# Patient Record
Sex: Female | Born: 1935 | Race: White | Hispanic: Yes
Health system: Southern US, Community
[De-identification: ages and names within clinical notes are randomized; demographics above are authoritative.]

---

## 2001-06-04 ENCOUNTER — Ambulatory Visit (HOSPITAL_COMMUNITY): Admission: RE | Admit: 2001-06-04 | Discharge: 2001-06-04 | Payer: Self-pay | Admitting: Family Medicine

## 2001-10-11 ENCOUNTER — Ambulatory Visit (HOSPITAL_COMMUNITY): Admission: RE | Admit: 2001-10-11 | Discharge: 2001-10-11 | Payer: Self-pay | Admitting: Family Medicine

## 2001-10-11 ENCOUNTER — Encounter: Payer: Self-pay | Admitting: Family Medicine

## 2001-10-30 ENCOUNTER — Encounter: Admission: RE | Admit: 2001-10-30 | Discharge: 2001-10-30 | Payer: Self-pay | Admitting: Obstetrics & Gynecology

## 2001-12-10 ENCOUNTER — Ambulatory Visit (HOSPITAL_COMMUNITY): Admission: RE | Admit: 2001-12-10 | Discharge: 2001-12-10 | Payer: Self-pay | Admitting: Obstetrics & Gynecology

## 2001-12-10 ENCOUNTER — Encounter: Payer: Self-pay | Admitting: Obstetrics & Gynecology

## 2002-01-15 ENCOUNTER — Encounter: Admission: RE | Admit: 2002-01-15 | Discharge: 2002-01-15 | Payer: Self-pay | Admitting: Obstetrics & Gynecology

## 2002-07-12 ENCOUNTER — Encounter: Admission: RE | Admit: 2002-07-12 | Discharge: 2002-07-12 | Payer: Self-pay | Admitting: *Deleted

## 2003-09-23 ENCOUNTER — Ambulatory Visit (HOSPITAL_COMMUNITY): Admission: RE | Admit: 2003-09-23 | Discharge: 2003-09-23 | Payer: Self-pay | Admitting: Ophthalmology

## 2004-09-23 ENCOUNTER — Ambulatory Visit: Payer: Self-pay | Admitting: Family Medicine

## 2004-10-11 ENCOUNTER — Ambulatory Visit: Payer: Self-pay | Admitting: Family Medicine

## 2004-10-13 ENCOUNTER — Ambulatory Visit: Payer: Self-pay | Admitting: Family Medicine

## 2004-10-22 ENCOUNTER — Ambulatory Visit (HOSPITAL_COMMUNITY): Admission: RE | Admit: 2004-10-22 | Discharge: 2004-10-22 | Payer: Self-pay | Admitting: Family Medicine

## 2005-02-23 ENCOUNTER — Ambulatory Visit: Payer: Self-pay | Admitting: Family Medicine

## 2005-02-28 ENCOUNTER — Ambulatory Visit: Payer: Self-pay | Admitting: *Deleted

## 2006-11-06 ENCOUNTER — Ambulatory Visit (HOSPITAL_COMMUNITY): Admission: RE | Admit: 2006-11-06 | Discharge: 2006-11-06 | Payer: Self-pay | Admitting: Otolaryngology

## 2006-11-14 ENCOUNTER — Other Ambulatory Visit: Admission: RE | Admit: 2006-11-14 | Discharge: 2006-11-14 | Payer: Self-pay | Admitting: Diagnostic Radiology

## 2006-11-14 ENCOUNTER — Encounter: Admission: RE | Admit: 2006-11-14 | Discharge: 2006-11-14 | Payer: Self-pay | Admitting: Otolaryngology

## 2006-11-14 ENCOUNTER — Encounter (INDEPENDENT_AMBULATORY_CARE_PROVIDER_SITE_OTHER): Payer: Self-pay | Admitting: Specialist

## 2007-03-21 ENCOUNTER — Ambulatory Visit: Payer: Self-pay | Admitting: Family Medicine

## 2007-03-28 ENCOUNTER — Ambulatory Visit: Payer: Self-pay | Admitting: Family Medicine

## 2007-04-03 ENCOUNTER — Ambulatory Visit: Payer: Self-pay | Admitting: Family Medicine

## 2007-06-04 ENCOUNTER — Ambulatory Visit: Payer: Self-pay | Admitting: Family Medicine

## 2007-06-19 ENCOUNTER — Ambulatory Visit (HOSPITAL_COMMUNITY): Admission: RE | Admit: 2007-06-19 | Discharge: 2007-06-19 | Payer: Self-pay | Admitting: Family Medicine

## 2007-09-12 ENCOUNTER — Encounter (INDEPENDENT_AMBULATORY_CARE_PROVIDER_SITE_OTHER): Payer: Self-pay | Admitting: *Deleted

## 2007-11-02 ENCOUNTER — Encounter (INDEPENDENT_AMBULATORY_CARE_PROVIDER_SITE_OTHER): Payer: Self-pay | Admitting: Nurse Practitioner

## 2008-03-27 ENCOUNTER — Ambulatory Visit: Payer: Self-pay | Admitting: Nurse Practitioner

## 2008-03-27 LAB — CONVERTED CEMR LAB
Eosinophils Relative: 1 % (ref 0–5)
HCT: 40.9 % (ref 36.0–46.0)
Lymphocytes Relative: 24 % (ref 12–46)
Lymphs Abs: 1.5 10*3/uL (ref 0.7–4.0)
Monocytes Relative: 14 % — ABNORMAL HIGH (ref 3–12)
Neutrophils Relative %: 61 % (ref 43–77)
Platelets: 207 10*3/uL (ref 150–400)
RBC: 4.35 M/uL (ref 3.87–5.11)
WBC: 6.3 10*3/uL (ref 4.0–10.5)

## 2008-05-27 ENCOUNTER — Ambulatory Visit: Payer: Self-pay | Admitting: Family Medicine

## 2008-05-27 DIAGNOSIS — L719 Rosacea, unspecified: Secondary | ICD-10-CM | POA: Insufficient documentation

## 2008-05-27 DIAGNOSIS — K029 Dental caries, unspecified: Secondary | ICD-10-CM | POA: Insufficient documentation

## 2008-05-27 DIAGNOSIS — I10 Essential (primary) hypertension: Secondary | ICD-10-CM | POA: Insufficient documentation

## 2008-06-02 ENCOUNTER — Encounter (INDEPENDENT_AMBULATORY_CARE_PROVIDER_SITE_OTHER): Payer: Self-pay | Admitting: Family Medicine

## 2008-11-11 ENCOUNTER — Telehealth (INDEPENDENT_AMBULATORY_CARE_PROVIDER_SITE_OTHER): Payer: Self-pay | Admitting: *Deleted

## 2008-12-15 ENCOUNTER — Ambulatory Visit: Payer: Self-pay | Admitting: Family Medicine

## 2008-12-15 DIAGNOSIS — M546 Pain in thoracic spine: Secondary | ICD-10-CM | POA: Insufficient documentation

## 2008-12-25 ENCOUNTER — Ambulatory Visit (HOSPITAL_COMMUNITY): Admission: RE | Admit: 2008-12-25 | Discharge: 2008-12-25 | Payer: Self-pay | Admitting: Family Medicine

## 2009-04-21 ENCOUNTER — Ambulatory Visit: Payer: Self-pay | Admitting: Family Medicine

## 2009-04-21 ENCOUNTER — Encounter (INDEPENDENT_AMBULATORY_CARE_PROVIDER_SITE_OTHER): Payer: Self-pay | Admitting: Family Medicine

## 2009-04-21 LAB — CONVERTED CEMR LAB
Bilirubin Urine: NEGATIVE
Glucose, Urine, Semiquant: 100
Protein, U semiquant: NEGATIVE

## 2009-04-30 ENCOUNTER — Ambulatory Visit (HOSPITAL_COMMUNITY): Admission: RE | Admit: 2009-04-30 | Discharge: 2009-04-30 | Payer: Self-pay | Admitting: Family Medicine

## 2009-05-07 ENCOUNTER — Encounter (INDEPENDENT_AMBULATORY_CARE_PROVIDER_SITE_OTHER): Payer: Self-pay | Admitting: Family Medicine

## 2009-05-08 ENCOUNTER — Ambulatory Visit: Payer: Self-pay | Admitting: Family Medicine

## 2009-05-13 DIAGNOSIS — E785 Hyperlipidemia, unspecified: Secondary | ICD-10-CM

## 2009-05-13 LAB — CONVERTED CEMR LAB
Albumin: 4.2 g/dL (ref 3.5–5.2)
BUN: 22 mg/dL (ref 6–23)
Calcium: 9.2 mg/dL (ref 8.4–10.5)
Chloride: 106 meq/L (ref 96–112)
Cholesterol: 214 mg/dL — ABNORMAL HIGH (ref 0–200)
Glucose, Bld: 91 mg/dL (ref 70–99)
HDL: 38 mg/dL — ABNORMAL LOW (ref >39)
Potassium: 4.8 meq/L (ref 3.5–5.3)
Triglycerides: 233 mg/dL — ABNORMAL HIGH (ref <150)

## 2009-09-29 ENCOUNTER — Ambulatory Visit: Payer: Self-pay | Admitting: Physician Assistant

## 2009-10-13 ENCOUNTER — Telehealth: Payer: Self-pay | Admitting: Physician Assistant

## 2009-11-02 ENCOUNTER — Ambulatory Visit: Payer: Self-pay | Admitting: Physician Assistant

## 2009-12-09 LAB — CONVERTED CEMR LAB
Total CHOL/HDL Ratio: 6
VLDL: 53 mg/dL — ABNORMAL HIGH (ref 0–40)

## 2010-01-18 ENCOUNTER — Telehealth: Payer: Self-pay | Admitting: Physician Assistant

## 2010-02-10 ENCOUNTER — Ambulatory Visit: Payer: Self-pay | Admitting: Physician Assistant

## 2010-02-22 LAB — CONVERTED CEMR LAB
Alkaline Phosphatase: 67 units/L (ref 39–117)
Cholesterol: 178 mg/dL (ref 0–200)
Indirect Bilirubin: 0.3 mg/dL (ref 0.0–0.9)
LDL Cholesterol: 98 mg/dL (ref 0–99)
Total Protein: 7.7 g/dL (ref 6.0–8.3)
Triglycerides: 186 mg/dL — ABNORMAL HIGH (ref <150)

## 2010-04-14 ENCOUNTER — Ambulatory Visit: Payer: Self-pay | Admitting: Physician Assistant

## 2010-04-14 DIAGNOSIS — R109 Unspecified abdominal pain: Secondary | ICD-10-CM

## 2010-04-14 LAB — CONVERTED CEMR LAB
Ketones, urine, test strip: NEGATIVE
Nitrite: NEGATIVE
Specific Gravity, Urine: >=1.03
Urobilinogen, UA: 0.2

## 2010-04-15 ENCOUNTER — Encounter: Payer: Self-pay | Admitting: Physician Assistant

## 2010-04-15 LAB — CONVERTED CEMR LAB
Albumin: 4.5 g/dL (ref 3.5–5.2)
BUN: 26 mg/dL — ABNORMAL HIGH (ref 6–23)
Basophils Relative: 0 % (ref 0–1)
CO2: 23 meq/L (ref 19–32)
Calcium: 9.7 mg/dL (ref 8.4–10.5)
Chloride: 104 meq/L (ref 96–112)
Creatinine, Ser: 0.9 mg/dL (ref 0.40–1.20)
Eosinophils Absolute: 0.1 10*3/uL (ref 0.0–0.7)
Eosinophils Relative: 1 % (ref 0–5)
Glucose, Bld: 111 mg/dL — ABNORMAL HIGH (ref 70–99)
HCT: 39 % (ref 36.0–46.0)
Hemoglobin: 12.9 g/dL (ref 12.0–15.0)
MCHC: 33.1 g/dL (ref 30.0–36.0)
MCV: 93.5 fL (ref 78.0–100.0)
Monocytes Absolute: 0.4 10*3/uL (ref 0.1–1.0)
Monocytes Relative: 6 % (ref 3–12)
Neutrophils Relative %: 59 % (ref 43–77)
RBC: 4.17 M/uL (ref 3.87–5.11)
TSH: 1.828 microintl units/mL (ref 0.350–4.500)

## 2010-05-20 ENCOUNTER — Ambulatory Visit (HOSPITAL_COMMUNITY): Admission: RE | Admit: 2010-05-20 | Discharge: 2010-05-20 | Payer: Self-pay | Admitting: Internal Medicine

## 2010-06-14 ENCOUNTER — Telehealth: Payer: Self-pay | Admitting: Physician Assistant

## 2010-06-14 ENCOUNTER — Ambulatory Visit: Payer: Self-pay | Admitting: Physician Assistant

## 2010-06-14 DIAGNOSIS — R82998 Other abnormal findings in urine: Secondary | ICD-10-CM | POA: Insufficient documentation

## 2010-06-14 DIAGNOSIS — E041 Nontoxic single thyroid nodule: Secondary | ICD-10-CM

## 2010-06-14 DIAGNOSIS — E739 Lactose intolerance, unspecified: Secondary | ICD-10-CM | POA: Insufficient documentation

## 2010-06-14 LAB — CONVERTED CEMR LAB
Bilirubin Urine: NEGATIVE
Ketones, urine, test strip: NEGATIVE
Specific Gravity, Urine: >=1.03
pH: 5.5

## 2010-06-15 ENCOUNTER — Encounter: Payer: Self-pay | Admitting: Physician Assistant

## 2010-06-16 LAB — CONVERTED CEMR LAB
Cholesterol: 158 mg/dL (ref 0–200)
Crystals: NONE SEEN
HDL goal, serum: 40 mg/dL
HDL: 38 mg/dL — ABNORMAL LOW (ref >39)
LDL Goal: 130 mg/dL
RBC / HPF: NONE SEEN (ref <3)
Total CHOL/HDL Ratio: 4.2
WBC, UA: NONE SEEN cells/hpf (ref <3)

## 2010-06-18 ENCOUNTER — Encounter: Payer: Self-pay | Admitting: Physician Assistant

## 2010-06-18 ENCOUNTER — Ambulatory Visit (HOSPITAL_COMMUNITY): Admission: RE | Admit: 2010-06-18 | Discharge: 2010-06-18 | Payer: Self-pay | Admitting: Internal Medicine

## 2010-06-24 ENCOUNTER — Encounter: Payer: Self-pay | Admitting: Physician Assistant

## 2010-06-25 ENCOUNTER — Ambulatory Visit: Payer: Self-pay | Admitting: Physician Assistant

## 2010-06-25 DIAGNOSIS — R42 Dizziness and giddiness: Secondary | ICD-10-CM

## 2010-06-25 DIAGNOSIS — R011 Cardiac murmur, unspecified: Secondary | ICD-10-CM

## 2010-07-01 ENCOUNTER — Ambulatory Visit: Payer: Self-pay | Admitting: Physician Assistant

## 2010-07-01 LAB — CONVERTED CEMR LAB
Nitrite: NEGATIVE
Urobilinogen, UA: 0.2
WBC Urine, dipstick: NEGATIVE
pH: 5

## 2010-07-02 LAB — CONVERTED CEMR LAB
Bacteria, UA: NONE SEEN
Casts: NONE SEEN /lpf
RBC / HPF: NONE SEEN (ref <3)
WBC, UA: NONE SEEN cells/hpf (ref <3)

## 2010-07-07 ENCOUNTER — Ambulatory Visit: Payer: Self-pay | Admitting: Physician Assistant

## 2010-07-07 DIAGNOSIS — R269 Unspecified abnormalities of gait and mobility: Secondary | ICD-10-CM | POA: Insufficient documentation

## 2010-07-13 ENCOUNTER — Encounter: Payer: Self-pay | Admitting: Physician Assistant

## 2010-07-14 ENCOUNTER — Encounter: Payer: Self-pay | Admitting: Physician Assistant

## 2010-07-14 ENCOUNTER — Ambulatory Visit: Payer: Self-pay | Admitting: Cardiology

## 2010-07-14 ENCOUNTER — Encounter (INDEPENDENT_AMBULATORY_CARE_PROVIDER_SITE_OTHER): Payer: Self-pay | Admitting: Internal Medicine

## 2010-07-14 ENCOUNTER — Ambulatory Visit (HOSPITAL_COMMUNITY): Admission: RE | Admit: 2010-07-14 | Discharge: 2010-07-14 | Payer: Self-pay | Admitting: Internal Medicine

## 2010-07-19 ENCOUNTER — Encounter (INDEPENDENT_AMBULATORY_CARE_PROVIDER_SITE_OTHER): Payer: Self-pay | Admitting: *Deleted

## 2010-07-22 ENCOUNTER — Encounter: Payer: Self-pay | Admitting: Physician Assistant

## 2010-07-22 DIAGNOSIS — M899 Disorder of bone, unspecified: Secondary | ICD-10-CM | POA: Insufficient documentation

## 2010-07-22 DIAGNOSIS — M949 Disorder of cartilage, unspecified: Secondary | ICD-10-CM

## 2010-08-19 ENCOUNTER — Ambulatory Visit (HOSPITAL_COMMUNITY): Admission: RE | Admit: 2010-08-19 | Discharge: 2010-08-19 | Payer: Self-pay | Admitting: Physician Assistant

## 2010-08-19 ENCOUNTER — Ambulatory Visit: Payer: Self-pay | Admitting: Physician Assistant

## 2010-08-23 DIAGNOSIS — M19019 Primary osteoarthritis, unspecified shoulder: Secondary | ICD-10-CM | POA: Insufficient documentation

## 2010-08-25 ENCOUNTER — Telehealth: Payer: Self-pay | Admitting: Physician Assistant

## 2010-08-25 ENCOUNTER — Ambulatory Visit: Payer: Self-pay | Admitting: Gastroenterology

## 2010-08-25 ENCOUNTER — Encounter (INDEPENDENT_AMBULATORY_CARE_PROVIDER_SITE_OTHER): Payer: Self-pay | Admitting: *Deleted

## 2010-09-08 ENCOUNTER — Ambulatory Visit: Payer: Self-pay | Admitting: Gastroenterology

## 2010-09-10 ENCOUNTER — Encounter: Payer: Self-pay | Admitting: Gastroenterology

## 2010-09-13 ENCOUNTER — Encounter
Admission: RE | Admit: 2010-09-13 | Discharge: 2010-12-12 | Payer: Self-pay | Source: Home / Self Care | Attending: Internal Medicine | Admitting: Internal Medicine

## 2010-09-15 ENCOUNTER — Encounter: Payer: Self-pay | Admitting: Physician Assistant

## 2010-09-23 ENCOUNTER — Encounter: Payer: Self-pay | Admitting: Physician Assistant

## 2010-10-02 IMAGING — CR DG SHOULDER 2+V*R*
4 series · 4 of 4 positions shown · non-contrast
Comparison: None.

CLINICAL DATA: Right shoulder pain.  No known injury .

RIGHT SHOULDER - 2+ VIEW

[w shoulder ap internal righ]
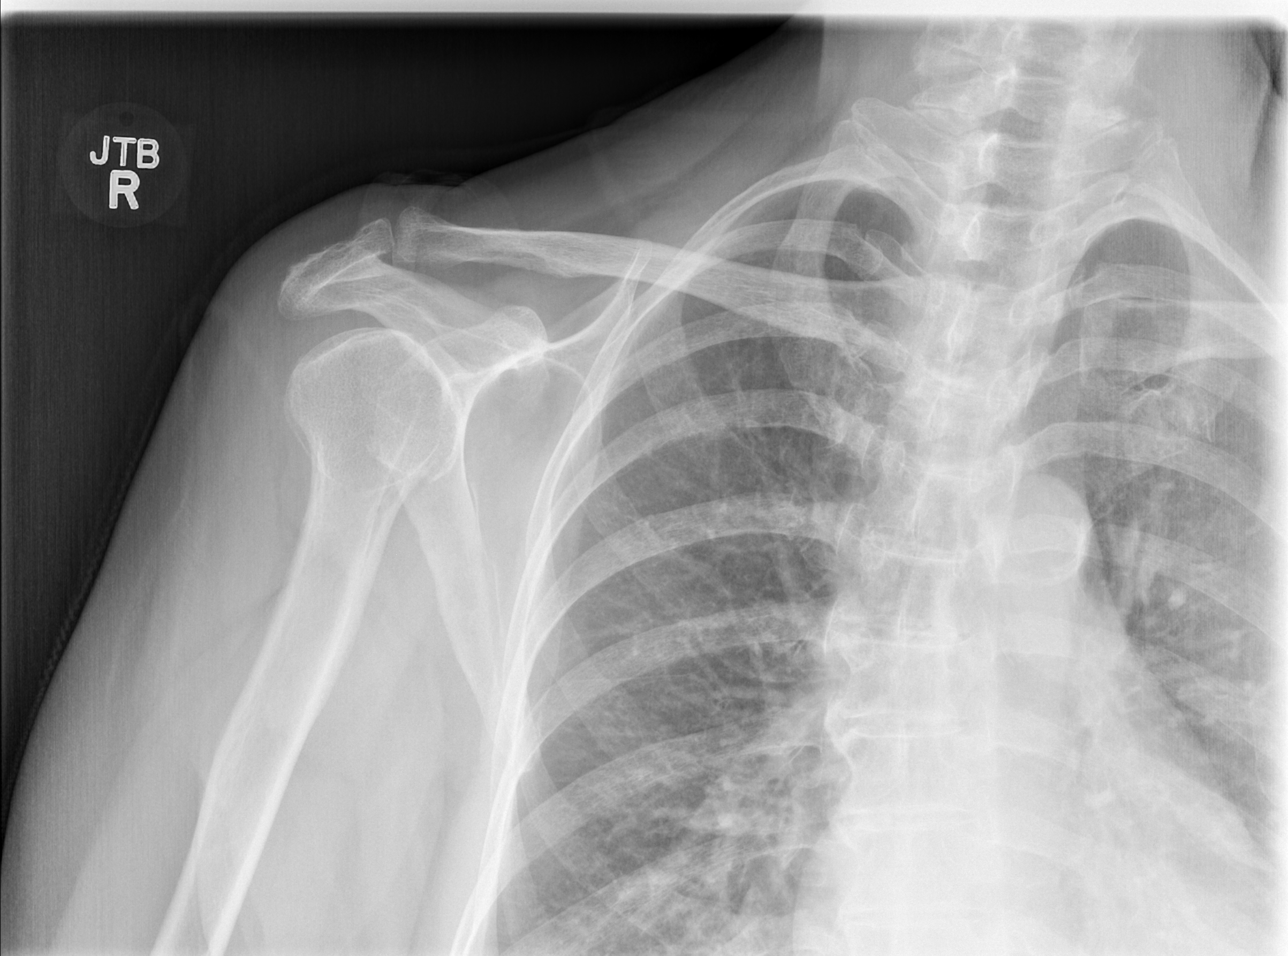

[w shoulder ap external righ]
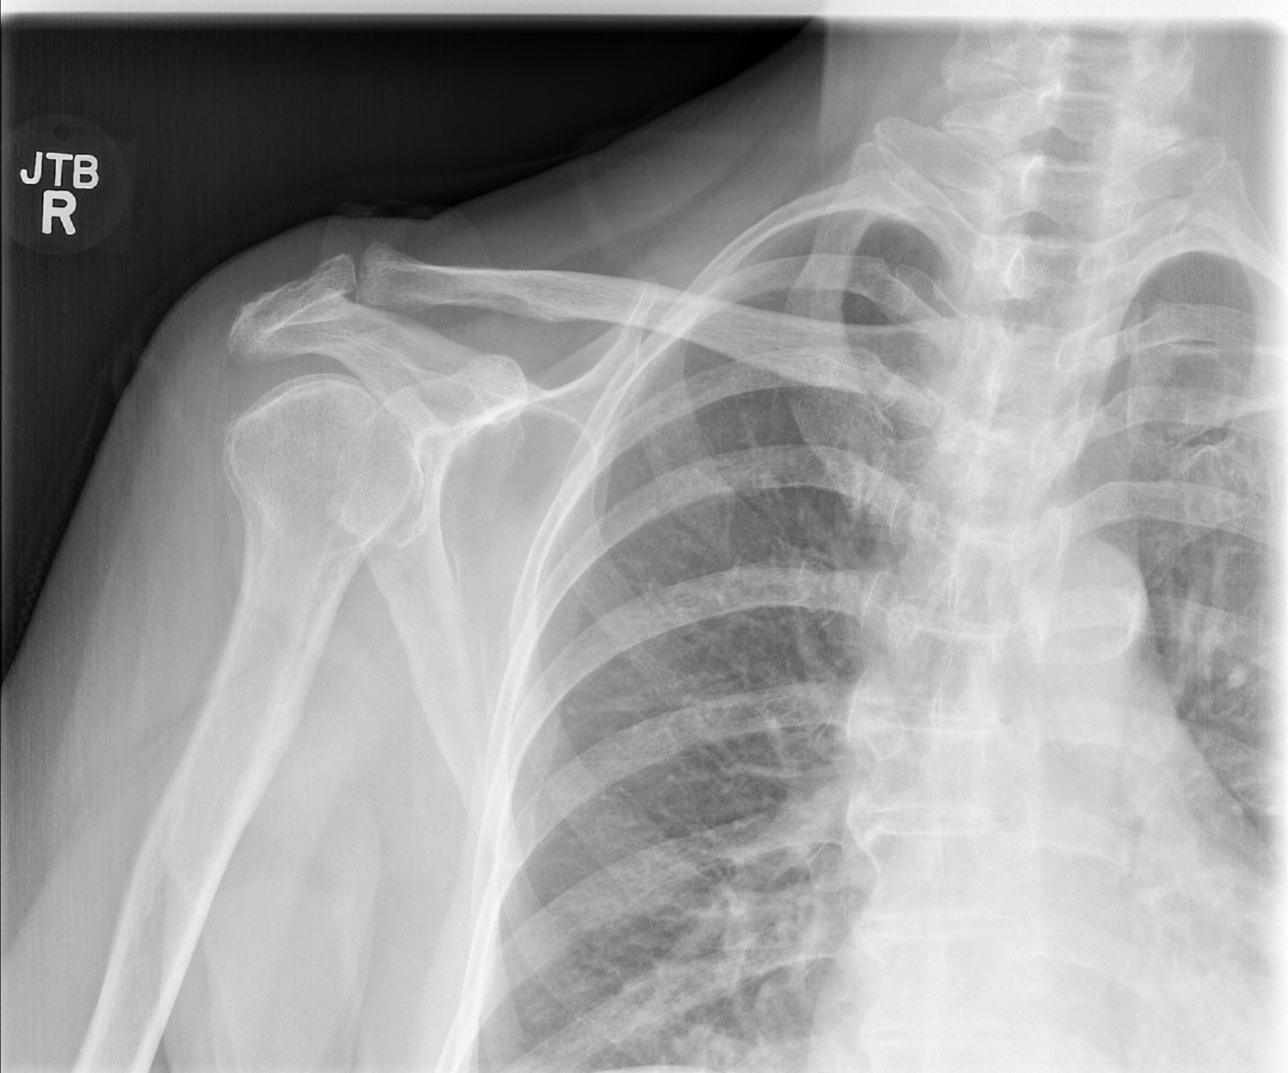

[w shoulder y view right (1 of 2)]
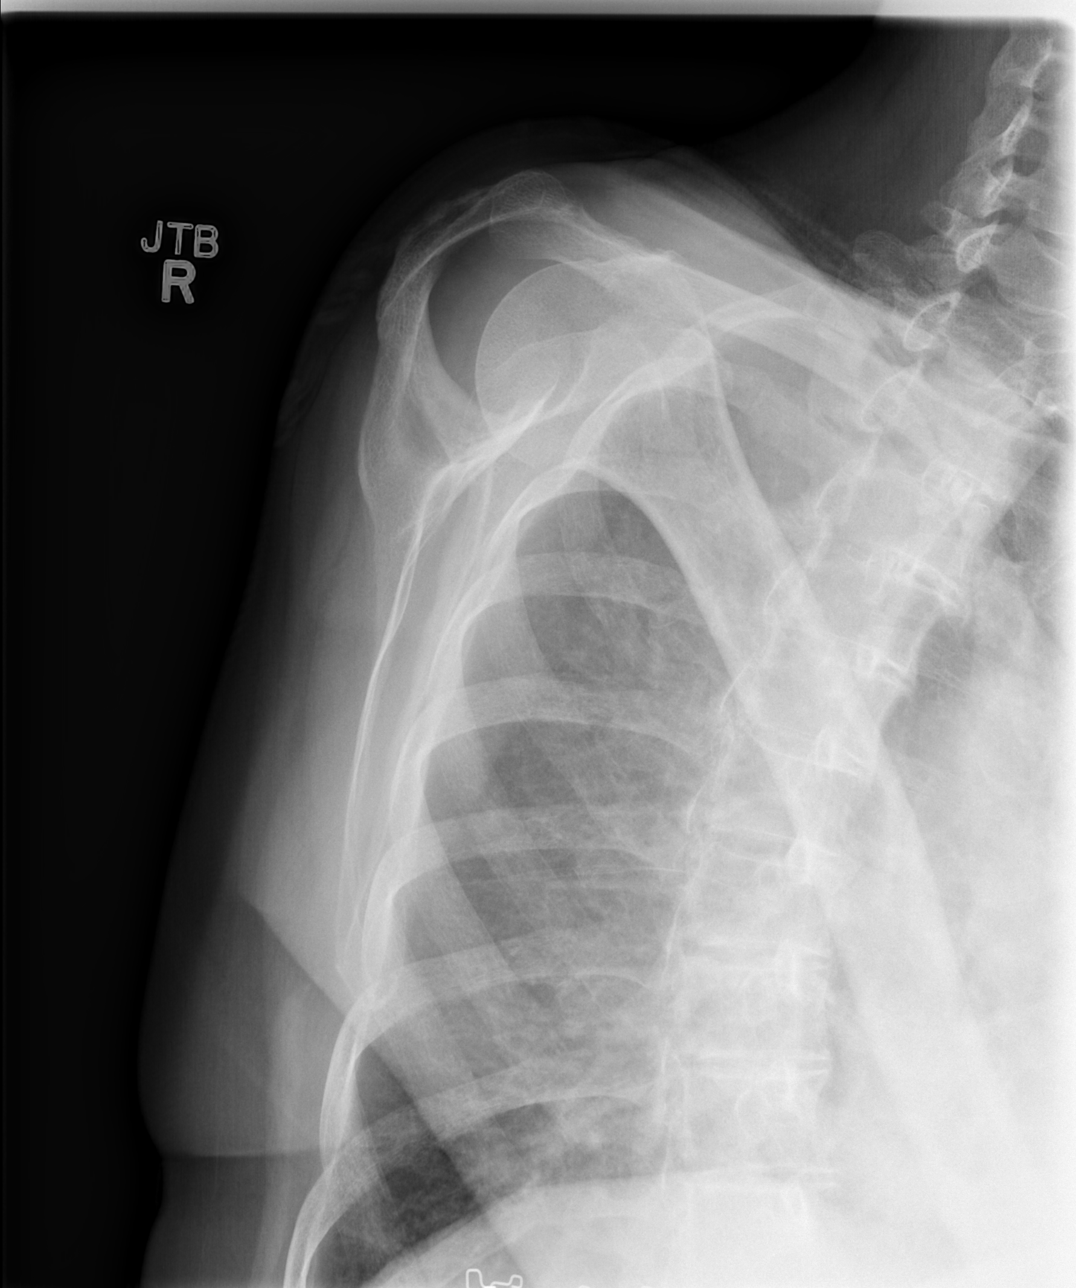

[w shoulder y view right (2 of 2)]
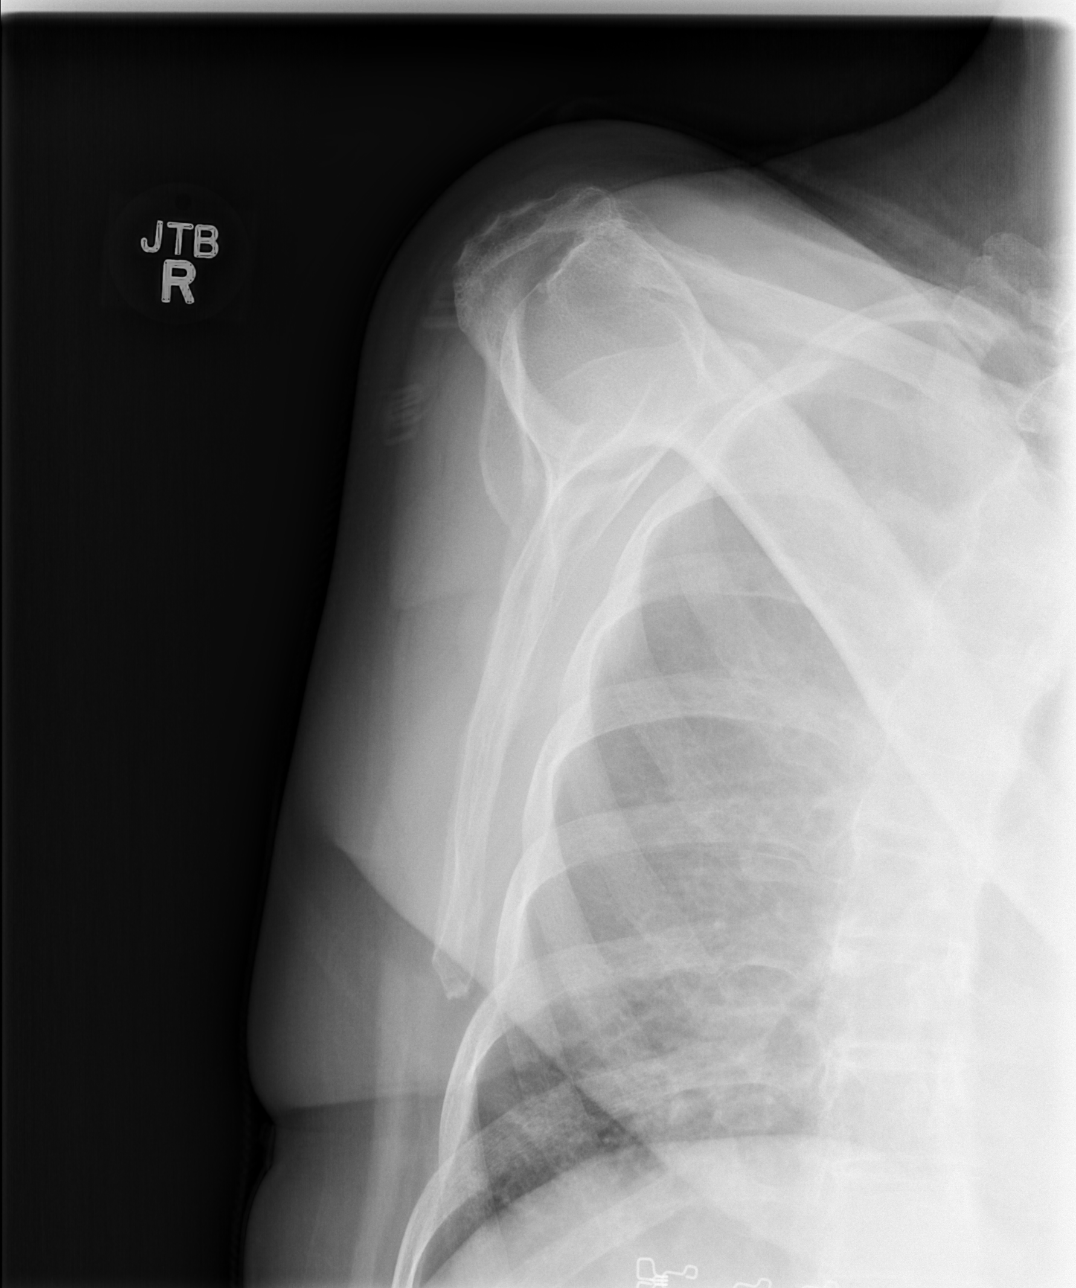

[4 of 4 positions shown; findings below may reference images not displayed]

FINDINGS: Ectasia and nonaneurysmal calcification of the thoracic
aorta is seen. Osteophytes are seen in the spine.  There is
osteopenic appearance of bones.

There is narrowing of AC joint space.  There is inferior spurring
of the distal clavicle at the AC joint.  There is congenital
downsloping of the acromion process.  No cervical rib is seen.  No
fracture or bony destruction is evident.  No calcific bursitis is
evident.  Tiny calcific density adjacent humeral head may reflect
small area of calcific tendonitis.
IMPRESSION: There is evidence of AC joint osteoarthritic change.  There is
inferior spurring of the distal clavicle at the AC joint.  There is
congenital downsloping of the acromion process.  These findings may
be associated with rotator cuff pathology and impingement in some
patients.  Tiny calcification near the humeral head may reflect a
small area of calcific tendonitis.

## 2010-10-20 ENCOUNTER — Encounter: Payer: Self-pay | Admitting: Physician Assistant

## 2010-10-28 ENCOUNTER — Ambulatory Visit: Payer: Self-pay | Admitting: Nurse Practitioner

## 2010-11-11 ENCOUNTER — Ambulatory Visit: Payer: Self-pay | Admitting: Nurse Practitioner

## 2010-11-11 LAB — CONVERTED CEMR LAB
Calcium: 9.7 mg/dL (ref 8.4–10.5)
Glucose, Bld: 85 mg/dL (ref 70–99)
Potassium: 5.1 meq/L (ref 3.5–5.3)
Sodium: 136 meq/L (ref 135–145)

## 2010-11-12 ENCOUNTER — Encounter (INDEPENDENT_AMBULATORY_CARE_PROVIDER_SITE_OTHER): Payer: Self-pay | Admitting: Nurse Practitioner

## 2010-11-15 ENCOUNTER — Telehealth (INDEPENDENT_AMBULATORY_CARE_PROVIDER_SITE_OTHER): Payer: Self-pay | Admitting: Nurse Practitioner

## 2010-12-08 ENCOUNTER — Ambulatory Visit: Payer: Self-pay | Admitting: Physician Assistant

## 2011-01-16 ENCOUNTER — Encounter: Payer: Self-pay | Admitting: Otolaryngology

## 2011-01-16 ENCOUNTER — Encounter: Payer: Self-pay | Admitting: Internal Medicine

## 2011-01-25 NOTE — Assessment & Plan Note (Signed)
Summary: ABDOMINAL AND HIP PAIN//GK   Vital Signs:  Patient profile:   77 year old female Height:      62 inches Weight:      139 pounds BMI:     25.52 Temp:     98.5 degrees F oral Pulse rate:   69 / minute Pulse rhythm:   regular Resp:     18 per minute BP sitting:   114 / 64  (left arm) Cuff size:   regular  Vitals Entered By: [place] Shannon (April 14, 2010 11:16 AM) CC: pt is here for abd. pain... pt says she has had this pain for a month now... pt also has hip pain in both hips... pt says she fell backwards and the pain goes together.....Marland Kitchen Is Patient Diabetic? No Pain Assessment Patient in pain? no       Does patient need assistance? Functional Status Self care Ambulation Normal   Primary Care Provider:  Tereso Newcomer PA-C  CC:  pt is here for abd. pain... pt says she has had this pain for a month now... pt also has hip pain in both hips... pt says she fell backwards and the pain goes together.......  History of Present Illness: First meeting with patient.  Previously followed by Dr. Barbaraann Barthel.  Has not been seen in some time.  Here for lower abd pain and hip pain. Complains of lower pelvic pain x 1 month.  It is bilateral.  Started to hurt after a fall.  She slipped in the tub and fell on her buttocks.  Did not go to the ED.  Pain is helped by tylenol.  Pain overall is getting better.  No radicular symptoms.  She says she felt weak in her legs before falling.  No loss of control in bowel or bladder function.  No dysuria or hesitancy.  No urinary frequency.  No blood in stools or black stools.   Pain is worsened by walking or sweeping or mopping.  Lifting something heavy makes worse.  Resting makes better.    Current Medications (verified): 1)  Lisinopril 10 Mg Tabs (Lisinopril) .... Tomar 1 Tableta Cada Manana (Take One Tablet Every Morning) 2)  Zoloft 50 Mg Tabs (Sertraline Hcl) .... Tomar 1 Tableta Antes De Acostarse (Take One Tablet At Bedtime) 3)  Metrogel 1 %  Gel  (Metronidazole) .... Apply Thin Film To Facial Rash Each Day 4)  Pravastatin Sodium 20 Mg Tabs (Pravastatin Sodium) .... Take 1 Tablet By Mouth Once A Day For Cholesterol 5)  Fish Oil 1000 Mg Caps (Omega-3 Fatty Acids) .... Take 1 Capsule By Mouth Once A Day  Allergies (verified): 1)  ! Penicillin  Review of Systems      See HPI General:  Denies fever, sweats, and weight loss. GI:  Denies constipation and diarrhea. GU:  Denies abnormal vaginal bleeding.  Physical Exam  General:  alert, well-developed, and well-nourished.   Head:  normocephalic and atraumatic.   Neck:  supple.   Lungs:  normal breath sounds, no crackles, and no wheezes.   Heart:  normal rate and regular rhythm.   Abdomen:  soft, normal bowel sounds, no distention, no masses, no guarding, no rigidity, no rebound tenderness, no abdominal hernia, no inguinal hernia, and no hepatomegaly.   + mild suprapubic tend with palp noted Msk:  no spinal tend to palp neg SLR bilat  Neurologic:  patellar and achilles DTRs 2+ bilat BLE strength 5/5 and equal in all muscle groups gait normal.   Psych:  normally interactive.     Impression & Recommendations:  Problem # 1:  ABDOMINAL PAIN OTHER SPECIFIED SITE (ICD-789.09) suspect she has strained her lower abdominal muscles she had a normal pelvic exam last year and no symptoms to creat worry about ovarian masses she has no evidence of inguinal or femoral hernias her urine is clean no assoc back pain or hip pain and her exam is normal except for pain with palpation over lower abd/pelvis  treat conservatively for now .  . . NSAIDs + heat f/u at CPE if continues, send for pelvic ultrasound  Orders: UA Dipstick w/o Micro (manual) (57846) T-Comprehensive Metabolic Panel (96295-28413) T-CBC w/Diff (24401-02725) T-Sed Rate (Automated) (36644-03474) T-TSH (25956-38756)  Problem # 2:  HYPERLIPIDEMIA (ICD-272.4) will make sure she is taking pravastatin and fish oil  Her  updated medication list for this problem includes:    Pravastatin Sodium 20 Mg Tabs (Pravastatin sodium) .Marland Kitchen... Take 1 tablet by mouth once a day for cholesterol  Problem # 3:  ESSENTIAL HYPERTENSION (ICD-401.9)  controlled  Her updated medication list for this problem includes:    Lisinopril 10 Mg Tabs (Lisinopril) .Marland Kitchen... Tomar 1 tableta cada manana (take one tablet every morning)  Orders: T-Comprehensive Metabolic Panel ([id number])  Complete Medication List: 1)  Lisinopril 10 Mg Tabs (Lisinopril) .... Tomar 1 tableta cada manana (take one tablet every morning) 2)  Zoloft 50 Mg Tabs (Sertraline hcl) .... Tomar 1 tableta antes de acostarse (take one tablet at bedtime) 3)  Metrogel 1 % Gel (Metronidazole) .... Apply thin film to facial rash each day 4)  Pravastatin Sodium 20 Mg Tabs (Pravastatin sodium) .... Take 1 tablet by mouth once a day for cholesterol 5)  Fish Oil 1000 Mg Caps (Omega-3 fatty acids) .... Take 1 capsule by mouth once a day  Patient Instructions: 1)  Take 400-600 mg of Ibuprofen (Advil, Motrin) with food every 6-8 hours for 7 days, then take every 6-8 hours as needed for pain. 2)  Apply heat to the area two times a day to three times a day. 3)  Schedule follow up with Jailon Schaible in 2 months for CPE.  Come fasting for blood work (nothing to eat or drink after midnight the night before except water). Prescriptions: FISH OIL 1000 MG CAPS (OMEGA-3 FATTY ACIDS) Take 1 capsule by mouth once a day  #30 x 11   Entered and Authorized by:   Tereso Newcomer PA-C   Signed by:   Tereso Newcomer PA-C on 04/14/2010   Method used:   Printed then faxed to ...       [facility] (retail)       [address] [place], [place]  [address]       Ph: [phone]       Fax: [phone]   RxID:   [id number] PRAVASTATIN SODIUM 20 MG TABS (PRAVASTATIN SODIUM) Take 1 tablet by mouth once a day for cholesterol  #30 x 5   Entered and Authorized by:    Tereso Newcomer PA-C   Signed by:   Tereso Newcomer PA-C on 04/14/2010   Method used:   Printed then faxed to ...       [facility] (retail)       [address] [place], [place]  [address]       Ph: [phone]       Fax: [phone]   RxID:  [id number]   Laboratory Results   Urine Tests    Routine Urinalysis   Color: yellow Appearance: Clear Glucose: negative   (Normal Range: Negative) Bilirubin: negative   (Normal Range: Negative) Ketone: negative   (Normal Range: Negative) Spec. Gravity: >=1.030   (Normal Range: 1.003-1.035) Blood: negative   (Normal Range: Negative) pH: 5.5   (Normal Range: 5.0-8.0) Protein: negative   (Normal Range: Negative) Urobilinogen: 0.2   (Normal Range: 0-1) Nitrite: negative   (Normal Range: Negative) Leukocyte Esterace: negative   (Normal Range: Negative)

## 2011-01-25 NOTE — Miscellaneous (Signed)
Summary: [facility]  Clinical Lists Changes  Medications: Added new medication of MIRALAX   POWD (POLYETHYLENE GLYCOL 3350) As per prep  instructions. - Signed Added new medication of DULCOLAX 5 MG  TBEC (BISACODYL) Day before procedure take 2 at 3pm and 2 at 8pm. - Signed Added new medication of REGLAN 10 MG  TABS (METOCLOPRAMIDE HCL) As per prep instructions. - Signed Rx of MIRALAX   POWD (POLYETHYLENE GLYCOL 3350) As per prep  instructions.;  #255gm x 0;  Signed;  Entered by: Ezra Sites RN;  Authorized by: Rachael Fee MD;  Method used: Print then Give to Patient Rx of DULCOLAX 5 MG  TBEC (BISACODYL) Day before procedure take 2 at 3pm and 2 at 8pm.;  #4 x 0;  Signed;  Entered by: Ezra Sites RN;  Authorized by: Rachael Fee MD;  Method used: Print then Give to Patient Rx of REGLAN 10 MG  TABS (METOCLOPRAMIDE HCL) As per prep instructions.;  #2 x 0;  Signed;  Entered by: Ezra Sites RN;  Authorized by: Rachael Fee MD;  Method used: Print then Give to Patient Allergies: Changed allergy or adverse reaction from PENICILLIN to PENICILLIN Added new allergy or adverse reaction of ASA    Prescriptions: REGLAN 10 MG  TABS (METOCLOPRAMIDE HCL) As per prep instructions.  #2 x 0   Entered by:   Ezra Sites RN   Authorized by:   Rachael Fee MD   Signed by:   Ezra Sites RN on 08/25/2010   Method used:   Print then Give to Patient   RxID:   [id number] DULCOLAX 5 MG  TBEC (BISACODYL) Day before procedure take 2 at 3pm and 2 at 8pm.  #4 x 0   Entered by:   Ezra Sites RN   Authorized by:   Rachael Fee MD   Signed by:   Ezra Sites RN on 08/25/2010   Method used:   Print then Give to Patient   RxID:   [id number] MIRALAX   POWD (POLYETHYLENE GLYCOL 3350) As per prep  instructions.  #255gm x 0   Entered by:   Ezra Sites RN   Authorized by:   Rachael Fee MD   Signed by:   Ezra Sites RN on 08/25/2010   Method used:   Print then Give to Patient   RxID:    [id number]   Appended Document: [facility] Rx for Miralax, Duccolax, and Reglan given to pt instead of Moviprep.  [facility] does not stock Moviprep

## 2011-01-25 NOTE — Assessment & Plan Note (Signed)
Summary: Right shoulder pain   Vital Signs:  Patient profile:   77 year old female Menstrual status:  postmenopausal Weight:      140.2 pounds BMI:     25.74 Temp:     97.5 degrees F oral Pulse rate:   72 / minute Pulse rhythm:   regular Resp:     16 per minute BP sitting:   134 / 60  (left arm) Cuff size:   regular  Vitals Entered By: Levon Hedger (November 11, 2010 12:14 PM)  Nutrition Counseling: Patient's BMI is greater than 25 and therefore counseled on weight management options. CC: follow-up visit R shoulder, Hypertension Management Is Patient Diabetic? No Pain Assessment Patient in pain? yes     Location: shoulder  Does patient need assistance? Functional Status Self care Ambulation Normal     Menstrual Status postmenopausal Last PAP Result NEGATIVE FOR INTRAEPITHELIAL LESIONS OR MALIGNANCY.   Primary Care Dodd Schmid:  Tereso Newcomer PA-C  CC:  follow-up visit R shoulder and Hypertension Management.  History of Present Illness:  Pt into the office to f/u on right shoulder 08/19/2010 - X-ray done There is evidence of AC joint osteoarthritic change.  There is inferior spurring of the distal clavicle at the [facility] joint.  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  These findings may be associated with rotator cuff pathology and impingement in some patients.  Tiny calcification near the humeral head may reflect a small area of calcific tendonitis  Pt was given a prednisone taper during last visit and reports that she took as ordered. Good results noted.  She is taking only tylenol for pain at this time  [employer] present with pt during exam - spanish  Hypertension History:      She denies headache, chest pain, and palpitations.  She notes no problems with any antihypertensive medication side effects.  pt is taking BP medications as ordered.        Positive major cardiovascular risk factors include female age 48 years old or older,  hyperlipidemia, and hypertension.  Negative major cardiovascular risk factors include no history of diabetes and non-tobacco-user status.    Medications Prior to Update: 1)  Lisinopril 10 Mg Tabs (Lisinopril) .... Tomar 1 Tableta Cada Manana (Take One Tablet Every Morning) 2)  Zoloft 50 Mg Tabs (Sertraline Hcl) .... Tomar 1 Tableta Antes De Acostarse (Take One Tablet At Bedtime) 3)  Metrogel 1 %  Gel (Metronidazole) .... Apply Thin Film To Facial Rash Each Day 4)  Pravastatin Sodium 20 Mg Tabs (Pravastatin Sodium) .... Take 1 Tablet By Mouth Once A Day For Cholesterol 5)  Fish Oil 1000 Mg Caps (Omega-3 Fatty Acids) .... Take 2 Capsules By Mouth Once A Day 6)  Flonase 50 Mcg/act Susp (Fluticasone Propionate) .... 2 Sprays Each Nostril Once Daily 7)  Meclizine Hcl 25 Mg Tabs (Meclizine Hcl) .... 1/2 Tab By Mouth Every 12 Hours As Needed For Dizziness 8)  Prednisone 10 Mg Tabs (Prednisone) .... 30mg  X 2 Days, 20mg  X 2 Days Then 10mg  X 2 Days By Mouth For Right Shoulder 9)  Tylenol Extra Strength 500 Mg Tabs (Acetaminophen) .... One Tablet By Mouth Two Times A Day As Needed  Current Medications (verified): 1)  Lisinopril 10 Mg Tabs (Lisinopril) .... Tomar 1 Tableta Cada Manana (Take One Tablet Every Morning) 2)  Zoloft 50 Mg Tabs (Sertraline Hcl) .... Tomar 1 Tableta Antes De Acostarse (Take One Tablet At Bedtime) 3)  Metrogel 1 %  Gel (Metronidazole) .Marland KitchenMarland KitchenMarland Kitchen  Apply Thin Film To Facial Rash Each Day 4)  Pravastatin Sodium 20 Mg Tabs (Pravastatin Sodium) .... Take 1 Tablet By Mouth Once A Day For Cholesterol 5)  Fish Oil 1000 Mg Caps (Omega-3 Fatty Acids) .... Take 2 Capsules By Mouth Once A Day 6)  Flonase 50 Mcg/act Susp (Fluticasone Propionate) .... 2 Sprays Each Nostril Once Daily 7)  Meclizine Hcl 25 Mg Tabs (Meclizine Hcl) .... 1/2 Tab By Mouth Every 12 Hours As Needed For Dizziness 8)  Prednisone 10 Mg Tabs (Prednisone) .... 30mg  X 2 Days, 20mg  X 2 Days Then 10mg  X 2 Days By Mouth For Right  Shoulder 9)  Tylenol Extra Strength 500 Mg Tabs (Acetaminophen) .... One Tablet By Mouth Two Times A Day As Needed  Allergies (verified): 1)  ! Penicillin 2)  ! Asa  Review of Systems CV:  Denies chest pain or discomfort. Resp:  Denies cough. MS:  Complains of joint pain; right shoulder.  Physical Exam  General:  alert.   Head:  normocephalic.   Lungs:  normal breath sounds.   Heart:  normal rate and regular rhythm.     Shoulder/Elbow Exam  Shoulder Exam:    Right:    Inspection:  Normal       Location:  right infrascapular    Stability:  stable    Tenderness:  no    Swelling:  no    Erythema:  no    ROM improved from previous visit   Impression & Recommendations:  Problem # 1:  OSTEOARTHRITIS, SHOULDER, RIGHT (ICD-715.91) Pt improved with prednisone will start anti-inflammatories  take two times a day x 7 days then as needed  advise pt to take meds with food Her updated medication list for this problem includes:    Tylenol Extra Strength 500 Mg Tabs (Acetaminophen) ..... One tablet by mouth two times a day as needed    Diclofenac Sodium 75 Mg Tbec (Diclofenac sodium) ..... One tablet by mouth by mouth two times a day for 1 week then as needed  Problem # 2:  ESSENTIAL HYPERTENSION (ICD-401.9) BP is stable continue current meds Her updated medication list for this problem includes:    Lisinopril 10 Mg Tabs (Lisinopril) .Marland Kitchen... Tomar 1 tableta cada manana (take one tablet every morning)  Orders: T-Basic Metabolic Panel [phone])  Complete Medication List: 1)  Lisinopril 10 Mg Tabs (Lisinopril) .... Tomar 1 tableta cada manana (take one tablet every morning) 2)  Zoloft 50 Mg Tabs (Sertraline hcl) .... Tomar 1 tableta antes de acostarse (take one tablet at bedtime) 3)  Pravastatin Sodium 20 Mg Tabs (Pravastatin sodium) .... Take 1 tablet by mouth once a day for cholesterol 4)  Fish Oil 1000 Mg Caps (Omega-3 fatty acids) .... Take 2 capsules by mouth once a  day 5)  Flonase 50 Mcg/act Susp (Fluticasone propionate) .... 2 sprays each nostril once daily 6)  Meclizine Hcl 25 Mg Tabs (Meclizine hcl) .... 1/2 tab by mouth every 12 hours as needed for dizziness 7)  Tylenol Extra Strength 500 Mg Tabs (Acetaminophen) .... One tablet by mouth two times a day as needed 8)  Diclofenac Sodium 75 Mg Tbec (Diclofenac sodium) .... One tablet by mouth by mouth two times a day for 1 week then as needed 9)  Glucosamine-chondroitin 500-250 Mg Caps (Glucosamine-chondroitin) .... One tablet by mouth two times a day for bones  Hypertension Assessment/Plan:      The patient's hypertensive risk group is category B: At least one risk factor (  excluding diabetes) with no target organ damage.  Her calculated 10 year risk of coronary heart disease is 9 %.  Today's blood pressure is 134/60.  Her blood pressure goal is < 140/90.   Patient Instructions: 1)  Right shoulder - doing better 2)  Take diclofenac 75mg  by mouth two times a day for inflammation for 1 week then take as needed.  Be sure to take this medication with food so it does not upset your stomach 3)  Folllow up as needed Prescriptions: DICLOFENAC SODIUM 75 MG TBEC (DICLOFENAC SODIUM) One tablet by mouth by mouth two times a day for 1 week then as needed  #50 x 0   Entered and Authorized by:   Lehman Prom FNP   Signed by:   Lehman Prom FNP on 11/11/2010   Method used:   Print then Give to Patient   RxID:   [id number]    Orders Added: 1)  Est. Patient Level III [30865] 2)  T-Basic Metabolic Panel [[id number]]

## 2011-01-25 NOTE — Progress Notes (Signed)
Summary: GI referral  Phone Note Outgoing Call   Summary of Call: Need screening colo.  Refer to Dr. Victorino Dike Initial call taken by: Tereso Newcomer PA-C,  June 14, 2010 11:28 AM

## 2011-01-25 NOTE — Miscellaneous (Signed)
Summary: Rehab Report//INITIAL SUMMARY  Rehab Report//INITIAL SUMMARY   Imported By: Arta Bruce 09/23/2010 11:00:37  _____________________________________________________________________  External Attachment:    Type:   Image     Comment:   External Document

## 2011-01-25 NOTE — Procedures (Signed)
Summary: Colonoscopy  Patient: Laura Taylor Note: All result statuses are Final unless otherwise noted.  Tests: (1) Colonoscopy (COL)   COL Colonoscopy           DONE     [facility]     [address]     [place], [place]  [address]           COLONOSCOPY PROCEDURE REPORT           PATIENT:  Laura Taylor, Laura Taylor  MR#:  [id number]     BIRTHDATE:  August 13, 1933, 77 yrs. old  GENDER:  female     ENDOSCOPIST:  Rachael Fee, MD     REF. BY:  Tereso Newcomer, P.A.-C     PROCEDURE DATE:  09/08/2010     PROCEDURE:  Colonoscopy with snare polypectomy     ASA CLASS:  Class II     INDICATIONS:  Routine Risk Screening     MEDICATIONS:   Fentanyl 50 mcg IV, Versed 6 mg IV     DESCRIPTION OF PROCEDURE:   After the risks benefits and     alternatives of the procedure were thoroughly explained, informed     consent was obtained.  Digital rectal exam was performed and     revealed no rectal masses.   The LB [id number] endoscope     was introduced through the anus and advanced to the cecum, which     was identified by both the appendix and ileocecal valve, without     limitations.  The quality of the prep was good, using MiraLax.     The instrument was then slowly withdrawn as the colon was fully     examined.     <<PROCEDUREIMAGES>>     FINDINGS:  A sessile polyp was found in the ascending colon. This     was 2mm across, removed with cold snare and sent to pathology (jar     1) (see image3).  Mild diverticulosis was found in the sigmoid to     descending colon segments (see image5).  This was otherwise a     normal examination of the colon (see image6, image1, and image2).     Retroflexed views in the rectum revealed no abnormalities.    The     scope was then withdrawn from the patient and the procedure     completed.     COMPLICATIONS:  None           ENDOSCOPIC IMPRESSION:     1) Small sessile polyp in the ascending colon, removed and sent     to  pathology.     2) Mild diverticulosis in the sigmoid to descending colon     segments     3) Otherwise normal examination           RECOMMENDATIONS:     1) If the polyp(s) removed today are proven to be adenomatous     (pre-cancerous) polyps, you will need a repeat colonoscopy in 5     years.     2) You will receive a letter within 1-2 weeks with the results     of your biopsy as well as final recommendations. Please call my     office if you have not received a letter after 3 weeks.           ______________________________     Rachael Fee, MD           n.  eSIGNED:   Rachael Fee at 09/08/2010 11:57 AM           Maldonado-altamirano, Delia Heady, [phone]  Note: An exclamation mark (!) indicates a result that was not dispersed into the flowsheet. Document Creation Date: 09/08/2010 11:58 AM _______________________________________________________________________  (1) Order result status: Final Collection or observation date-time: 09/08/2010 11:53 Requested date-time:  Receipt date-time:  Reported date-time:  Referring Physician:   Ordering Physician: Rob Bunting [phone]) Specimen Source:  Source: Launa Grill Order Number: [id number] Lab site:   Appended Document: Colonoscopy     Procedures Next Due Date:    Colonoscopy: 08/2015

## 2011-01-25 NOTE — Assessment & Plan Note (Signed)
Summary: DIZZINESS/ FU IN 6 WEEKSK//GK   Vital Signs:  Patient profile:   77 year old female Height:      62 inches Weight:      139 pounds BMI:     25.52 Temp:     97.2 degrees F oral Pulse rate:   68 / minute Pulse rhythm:   regular Resp:     20 per minute BP sitting:   124 / 51  (left arm) Cuff size:   regular  Vitals Entered By: [place] Shannon (August 19, 2010 10:28 AM) CC: f/u on dizzness... pt says she has a little pain on her right shoulder.....  Is Patient Diabetic? No Pain Assessment Patient in pain? no       Does patient need assistance? Functional Status Self care Ambulation Normal   Primary Care Eleshia Wooley:  Tereso Newcomer PA-C  CC:  f/u on dizzness... pt says she has a little pain on her right shoulder..... Marland Kitchen  History of Present Illness: Dizziness:  Resolved.  No further episodes and feels good.  Has appt with PT in few weeks.  Encouraged her to go for eval anyway.    Right shoulder:  Has had pain for years.  Notes exacerbations of pain at different times.  Usually takes some pain meds with relief.  Not very limiting and seems to be doing ok.  Notes increased pain with weather changes.  No h/o injury.    Current Medications (verified): 1)  Lisinopril 10 Mg Tabs (Lisinopril) .... Tomar 1 Tableta Cada Manana (Take One Tablet Every Morning) 2)  Zoloft 50 Mg Tabs (Sertraline Hcl) .... Tomar 1 Tableta Antes De Acostarse (Take One Tablet At Bedtime) 3)  Metrogel 1 %  Gel (Metronidazole) .... Apply Thin Film To Facial Rash Each Day 4)  Pravastatin Sodium 20 Mg Tabs (Pravastatin Sodium) .... Take 1 Tablet By Mouth Once A Day For Cholesterol 5)  Fish Oil 1000 Mg Caps (Omega-3 Fatty Acids) .... Take 2 Capsules By Mouth Once A Day 6)  Flonase 50 Mcg/act Susp (Fluticasone Propionate) .... 2 Sprays Each Nostril Once Daily 7)  Meclizine Hcl 25 Mg Tabs (Meclizine Hcl) .... 1/2 Tab By Mouth Every 12 Hours As Needed For Dizziness  Allergies (verified): 1)  ! Penicillin  Past  History:  Past Medical History: Last updated: 07/22/2010 h/o right thyroid nodule;s/p biopsy 11/16/07. Had f/u ENT visit(Dr.Crossley) 04/24/2007. No further f/u needed.   a.  repeat u/s 06/2010: stable ovoid mass superior to right superior lobe of thyroid felt to be benign Chronic Depression/Anxiety h/o unopposed estrogen use 05/29/01-10/10/01. Pt has NO CERVIX but does have a uterus. Had a hysteroscopy with endometrial biopsy(GYN clinic 12/10/01). Bx benign. Echo 06/2010:  EF 60%; aortic sclerosis without stenosis; PA peak pressure 43 mmHg DEXA 05/2010:  Osteopenia; FRAX: 10 yr - 6%; Hip fx 1%  Physical Exam  General:  alert, well-developed, and well-nourished.   Head:  normocephalic and atraumatic.   Msk:  right shoulder: + pain with empty can test  + crepitus with PROM AROM good - can ABduct to about 160-170 degrees + pain with int rotation; min with external rotation RUE strength is good in all muscle groups Neurologic:  alert & oriented X3 and cranial nerves II-XII intact.   Psych:  normally interactive.     Impression & Recommendations:  Problem # 1:  SHOULDER PAIN, RIGHT (ICD-719.41)  suspect DJD with RTC tendinopathy no significant limitations get xray tylenol and nsaids as needed she is to let me  know if limiting or getting worse if so could send to PT or sports med clinic  Orders: Diagnostic X-Ray/Fluoroscopy (Diagnostic X-Ray/Flu)  Problem # 2:  ESSENTIAL HYPERTENSION (ICD-401.9) controlled  Her updated medication list for this problem includes:    Lisinopril 10 Mg Tabs (Lisinopril) .Marland Kitchen... Tomar 1 tableta cada manana (take one tablet every morning)  Problem # 3:  HYPERLIPIDEMIA (ICD-272.4) check lipids in 3-4 mos  Her updated medication list for this problem includes:    Pravastatin Sodium 20 Mg Tabs (Pravastatin sodium) .Marland Kitchen... Take 1 tablet by mouth once a day for cholesterol  Problem # 4:  DIZZINESS (ICD-780.4) probl BPPV PT referral pending  Her updated  medication list for this problem includes:    Meclizine Hcl 25 Mg Tabs (Meclizine hcl) .Marland Kitchen... 1/2 tab by mouth every 12 hours as needed for dizziness  Complete Medication List: 1)  Lisinopril 10 Mg Tabs (Lisinopril) .... Tomar 1 tableta cada manana (take one tablet every morning) 2)  Zoloft 50 Mg Tabs (Sertraline hcl) .... Tomar 1 tableta antes de acostarse (take one tablet at bedtime) 3)  Metrogel 1 % Gel (Metronidazole) .... Apply thin film to facial rash each day 4)  Pravastatin Sodium 20 Mg Tabs (Pravastatin sodium) .... Take 1 tablet by mouth once a day for cholesterol 5)  Fish Oil 1000 Mg Caps (Omega-3 fatty acids) .... Take 2 capsules by mouth once a day 6)  Flonase 50 Mcg/act Susp (Fluticasone propionate) .... 2 sprays each nostril once daily 7)  Meclizine Hcl 25 Mg Tabs (Meclizine hcl) .... 1/2 tab by mouth every 12 hours as needed for dizziness  Patient Instructions: 1)  You can take Tylenol (Acetaminophen) 500 mg 1-2 tabs every 6 hours as needed for pain. 2)  You can take Ibuprofen 200 mg - take 2-3 tablets every 6-8 hours as needed for severe pain.  Take with food. 3)  Get the xray. 4)  Let me know if your shoulder pain gets worse or limits you more.

## 2011-01-25 NOTE — Letter (Signed)
Summary: *HSN Results Follow up  [facility]  [address] [place], [place] [address]   Phone: [phone]  Fax: [phone]      07/22/2010   [facility] [address] [place], [place]  [address]   Dear  Ms. Laura Taylor,                            ____S.Drinkard,FNP   ____D. Gore,FNP       ____B. McPherson,MD   ____V. Rankins,MD    ____E. Mulberry,MD    ____N. Daphine Deutscher, FNP  ____D. Reche Dixon, MD    ____K. Philipp Deputy, MD    __x__S. Alben Spittle, PA-C     This letter is to inform you that your recent test(s):  _______Pap Smear    _______Lab Test     _______X-ray    _______ is within acceptable limits  _______ requires a medication change  _______ requires a follow-up lab visit  _______ requires a follow-up visit with your provider   Comments: Echo (heart ultrasound) was ok.  Your heart function is good.  No valve problems.       _________________________________________________________ If you have any questions, please contact our office                     Sincerely,  Laura Newcomer PA-C [facility]

## 2011-01-25 NOTE — Assessment & Plan Note (Signed)
Summary: F/u Right shoulder   Vital Signs:  Patient profile:   77 year old female Weight:      141.3 pounds BMI:     25.94 Temp:     97.5 degrees F oral Pulse rate:   72 / minute Pulse rhythm:   regular Resp:     16 per minute BP sitting:   130 / 70  (left arm) Cuff size:   regular  Vitals Entered By: Levon Hedger (October 28, 2010 11:18 AM)  Nutrition Counseling: Patient's BMI is greater than 25 and therefore counseled on weight management options. CC: right arm painful  and it feels hot she had x-rays done but they were fine...she went to PT, Hypertension Management, Lipid Management Is Patient Diabetic? No Pain Assessment Patient in pain? yes     Location: arm Intensity: 7 Onset of pain  Constant  Does patient need assistance? Functional Status Self care Ambulation Normal   Primary Care Provider:  Tereso Newcomer PA-C  CC:  right arm painful  and it feels hot she had x-rays done but they were fine...she went to PT, Hypertension Management, and Lipid Management.  History of Present Illness:  Pt into the office for right arm pain. Previous x-rays done August 2011 There is evidence of AC joint osteoarthritic change.  There is inferior spurring of the distal clavicle at the [facility] joint.  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  These findings may be associated with rotator cuff pathology and impingement in some patients.  Tiny calcification near the humeral head may reflect a small area of calcific tendonitis.  Pt has been to physical therapy for the right arm pain.   Pt reports that she has been given the exercises to do at home. Still has problems with she lifts her arm to do her hair and most any activities that requires lifting. Writing and bending down is ok.  Pt has not taken any medications for the pain.  Hypertension History:      She denies headache, chest pain, and palpitations.  She notes no problems with any antihypertensive medication  side effects.        Positive major cardiovascular risk factors include female age 48 years old or older, hyperlipidemia, and hypertension.  Negative major cardiovascular risk factors include no history of diabetes and non-tobacco-user status.    Lipid Management History:      Positive NCEP/ATP III risk factors include female age 52 years old or older, HDL cholesterol less than 40, and hypertension.  Negative NCEP/ATP III risk factors include no history of early menopause without estrogen hormone replacement, non-diabetic, and non-tobacco-user status.        The patient states that she does not know about the "Therapeutic Lifestyle Change" diet.  Her compliance with the TLC diet is good.  The patient expresses understanding of adjunctive measures for cholesterol lowering.     Allergies (verified): 1)  ! Penicillin 2)  ! Asa  Review of Systems General:  Denies fever. CV:  Denies chest pain or discomfort. Resp:  Denies cough. GI:  Denies abdominal pain, nausea, and vomiting. MS:  Complains of joint pain; right shoulder.  Physical Exam  General:  alert.   Head:  normocephalic.   Ears:  ear piercing(s) noted.   Lungs:  normal breath sounds.   Heart:  normal rate and regular rhythm.   2/6 systolic murmur along LSB  Abdomen:  normal bowel sounds.   Msk:  up to the exam table  with assistance  Neurologic:  alert & oriented X3.   Skin:  color normal.   Psych:  Oriented X3.     Shoulder/Elbow Exam  Shoulder Exam:    Right:    Inspection:  Abnormal    Palpation:  Abnormal       Location:  right suprascapular    Stability:  stable    Tenderness:  right suprascapular    Swelling:  no    Erythema:  no    Range of Motion:       Flexion-Active: 60       Flexion-Passive: 90   Impression & Recommendations:  Problem # 1:  OSTEOARTHRITIS, SHOULDER, RIGHT (ICD-715.91)  pt to keep doing exercises at home  may take tylenol  pt has a great deal of splinting and restiction with  ROM will start on low dose prednisone taper and follow up  Her updated medication list for this problem includes:    Tylenol Extra Strength 500 Mg Tabs (Acetaminophen) ..... One tablet by mouth two times a day as needed  Problem # 2:  NEED PROPHYLACTIC VACCINATION&INOCULATION FLU (ICD-V04.81) Assessment: Unchanged  given today in office  Orders: Flu Vaccine 28yrs + ([address]) Admin 1st Vaccine ([address])  Complete Medication List: 1)  Lisinopril 10 Mg Tabs (Lisinopril) .... Tomar 1 tableta cada manana (take one tablet every morning) 2)  Zoloft 50 Mg Tabs (Sertraline hcl) .... Tomar 1 tableta antes de acostarse (take one tablet at bedtime) 3)  Metrogel 1 % Gel (Metronidazole) .... Apply thin film to facial rash each day 4)  Pravastatin Sodium 20 Mg Tabs (Pravastatin sodium) .... Take 1 tablet by mouth once a day for cholesterol 5)  Fish Oil 1000 Mg Caps (Omega-3 fatty acids) .... Take 2 capsules by mouth once a day 6)  Flonase 50 Mcg/act Susp (Fluticasone propionate) .... 2 sprays each nostril once daily 7)  Meclizine Hcl 25 Mg Tabs (Meclizine hcl) .... 1/2 tab by mouth every 12 hours as needed for dizziness 8)  Prednisone 10 Mg Tabs (Prednisone) .... 30mg  x 2 days, 20mg  x 2 days then 10mg  x 2 days by mouth for right shoulder 9)  Tylenol Extra Strength 500 Mg Tabs (Acetaminophen) .... One tablet by mouth two times a day as needed  Hypertension Assessment/Plan:      The patient's hypertensive risk group is category B: At least one risk factor (excluding diabetes) with no target organ damage.  Her calculated 10 year risk of coronary heart disease is 9 %.  Today's blood pressure is 130/70.  Her blood pressure goal is < 140/90.  Lipid Assessment/Plan:      Based on NCEP/ATP III, the patient's risk factor category is "2 or more risk factors and a calculated 10 year CAD risk of < 20%".  The patient's lipid goals are as follows: Total cholesterol goal is 200; LDL cholesterol goal is 130; HDL  cholesterol goal is 40; Triglyceride goal is 150.     Patient Instructions: 1)  Prednisone - Take according to the following 2)  Thursday: 3 tablets 3)  Friday: 3 tablets 4)  Saturday: 2 tablets 5)  Sunday: 2 tablets 6)  Monday: 1 tablet 7)  Tuesday: 1 tablet 8)  Also take Tylenol Extra Strength 500mg  by mouth two times a day for pain 9)  Follow up in 2 weeks with n.martin,fnp for right arm pain. Prescriptions: PREDNISONE 10 MG TABS (PREDNISONE) 30mg  x 2 days, 20mg  x 2 days then 10mg  x 2 days by mouth  for right shoulder  #12 x 0   Entered and Authorized by:   Lehman Prom FNP   Signed by:   Lehman Prom FNP on 10/28/2010   Method used:   Print then Give to Patient   RxID:   [id number]    Orders Added: 1)  Est. Patient Level III [86578] 2)  Flu Vaccine 48yrs + [46962] 3)  Admin 1st Vaccine [95284]   Immunizations Administered:  Influenza Vaccine # 1:    Vaccine Type: Fluvax 3+    Site: left deltoid    Mfr: GlaxoSmithKline    Dose: 0.5 ml    Route: IM    Given by: Levon Hedger    Exp. Date: 06/25/2011    Lot #: [id number]    VIS given: 07/20/10 version given October 28, 2010.  Flu Vaccine Consent Questions:    Do you have a history of severe allergic reactions to this vaccine? no    Any prior history of allergic reactions to egg and/or gelatin? no    Do you have a sensitivity to the preservative Thimersol? no    Do you have a past history of Guillan-Barre Syndrome? no    Do you currently have an acute febrile illness? no    Have you ever had a severe reaction to latex? no    Vaccine information given and explained to patient? yes    Are you currently pregnant? no    ndc  [phone]  Immunizations Administered:  Influenza Vaccine # 1:    Vaccine Type: Fluvax 3+    Site: left deltoid    Mfr: GlaxoSmithKline    Dose: 0.5 ml    Route: IM    Given by: Levon Hedger    Exp. Date: 06/25/2011    Lot #: [id number]    VIS given: 07/20/10 version  given October 28, 2010.  Prevention & Chronic Care Immunizations   Influenza vaccine: Fluvax 3+  (10/28/2010)    Tetanus booster: 04/21/2009: Tdap    Pneumococcal vaccine: Pneumovax  (04/21/2009)    H. zoster vaccine: Not documented  Colorectal Screening   Hemoccult: neg x 3  (06/24/2010)    Colonoscopy: DONE  (09/08/2010)   Colonoscopy due: 08/2015  Other Screening   Pap smear: NEGATIVE FOR INTRAEPITHELIAL LESIONS OR MALIGNANCY.  (04/21/2009)    Mammogram: No specific mammographic evidence of malignancy.  Assessment: BIRADS 1.   (05/20/2010)   Mammogram action/deferral: Screening mammogram in 1 year.     (05/20/2010)    DXA bone density scan:  Hip Total: T Score -2.5 to -1.0 Hip.      (06/18/2010)   Smoking status: never  (04/21/2009)  Lipids   Total Cholesterol: 158  (06/14/2010)   LDL: 87  (06/14/2010)   LDL Direct: Not documented   HDL: 38  (06/14/2010)   Triglycerides: 164  (06/14/2010)    SGOT (AST): 21  (04/14/2010)   SGPT (ALT): 21  (04/14/2010)   Alkaline phosphatase: 66  (04/14/2010)   Total bilirubin: 0.4  (04/14/2010)  Hypertension   Last Blood Pressure: 130 / 70  (10/28/2010)   Serum creatinine: 0.90  (04/14/2010)   Serum potassium 4.8  (04/14/2010)  Self-Management Support :    Hypertension self-management support: Not documented    Lipid self-management support: Not documented    Nursing Instructions: Give Flu vaccine today

## 2011-01-25 NOTE — Assessment & Plan Note (Signed)
Summary: DIZZY/EAR/////GK  Nurse Visit   Vital Signs:  Patient profile:   77 year old female Temp:     97.9 degrees F oral Pulse rate:   64 / minute Pulse rhythm:   regular Resp:     20 per minute BP sitting:   148 / 64  (right arm) Cuff size:   regular  Vitals Entered By: Dutch Quint RN (July 01, 2010 3:40 PM)  Patient Instructions: 1)  Complete Z-pak antibiotic 2)  Meclizine 1/2 tab every 12 hours as needed for dizziness 3)  If you feel worse, call the office 4)  See provider next week for follow-up   Primary Care Provider:  Tereso Newcomer PA-C  CC:  dizziness, ear pain, and Abdominal Pain.  History of Present Illness: Very shaky, SOB, when I got up I was dizzy.  When I sat down, I was SOB, and then I was dizzy.  Feel like her head is empty, ears hurt like she's in a plane.  Exactly the same as last week when she saw provider.  Larey Seat off a bed five months ago, had bump on her head.  Ear pain on the right started at the same time.  Afterwards the ear continued to have pain.  Dizziness started after the fall, began to walk sideways a little.  Didn't see a doctor after the fall.   Review of Systems       When she has sensation of clogged ears, she hears less.  Had a couple of episodes where she almost fainted.  Has headaches at the base of her neck, some fatigue at the nape of her neck. General:  Complains of chills, fatigue, loss of appetite, malaise, sleep disorder, and weakness; feels shaky when she doesn't feel well.. ENT:  Complains of decreased hearing, difficulty swallowing, earache, hoarseness, ringing in ears, and sinus pressure; denies ear discharge, nasal congestion, nosebleeds, postnasal drainage, and sore throat; feels hearing lessens when she has ear pressure.  Had a cold three or four weeks ago, had nasal drainage.  Ringing in her left ear.  Sinus pressures across her forehead..   Impression & Recommendations:  Problem # 1:  DIZZINESS (ICD-780.4)  Her updated  medication list for this problem includes:    Meclizine Hcl 25 Mg Tabs (Meclizine hcl) .Marland Kitchen... 1/2 tab by mouth every 12 hours as needed for dizziness  Complete Medication List: 1)  Lisinopril 10 Mg Tabs (Lisinopril) .... Tomar 1 tableta cada manana (take one tablet every morning) 2)  Zoloft 50 Mg Tabs (Sertraline hcl) .... Tomar 1 tableta antes de acostarse (take one tablet at bedtime) 3)  Metrogel 1 % Gel (Metronidazole) .... Apply thin film to facial rash each day 4)  Pravastatin Sodium 20 Mg Tabs (Pravastatin sodium) .... Take 1 tablet by mouth once a day for cholesterol 5)  Fish Oil 1000 Mg Caps (Omega-3 fatty acids) .... Take 2 capsules by mouth once a day 6)  Flonase 50 Mcg/act Susp (Fluticasone propionate) .... 2 spray in each nostril for 3 weeks, then use every other day for 2 days and stop 7)  Meclizine Hcl 25 Mg Tabs (Meclizine hcl) .... 1/2 tab by mouth every 12 hours as needed for dizziness  Other Orders: UA Dipstick w/o Micro (manual) ([address]) T- * Misc. Laboratory test [phone])    Physical Exam  General:  alert, well-nourished, and well-hydrated.   Head:  normocephalic and atraumatic.   Ears:  R ear normal and L ear normal, no redness or  drainage.  L ear has cerumen. Nose:  no nasal discharge.   Mouth:  pharynx pink and moist.   Lungs:  normal respiratory effort.  Lungs clear bilaterally.  CC: dizziness, ear pain, Abdominal Pain Is Patient Diabetic? No Pain Assessment Patient in pain? no       Does patient need assistance? Functional Status Self care Ambulation Impaired:Risk for fall Comments States she only started taking Z-pak on Tuesday, has not used Meclizine at all.   Allergies: 1)  ! Penicillin Laboratory Results   Urine Tests  Date/Time Received: 07-01-10  16:10  Routine Urinalysis   Color: lt. yellow Glucose: negative   (Normal Range: Negative) Bilirubin: negative   (Normal Range: Negative) Ketone: negative   (Normal Range: Negative) Spec.  Gravity: <1.005   (Normal Range: 1.003-1.035) Blood: trace-intact   (Normal Range: Negative) pH: 5.0   (Normal Range: 5.0-8.0) Protein: negative   (Normal Range: Negative) Urobilinogen: 0.2   (Normal Range: 0-1) Nitrite: negative   (Normal Range: Negative) Leukocyte Esterace: negative   (Normal Range: Negative)       Orders Added: 1)  Est. Patient Level I [[address]] 2)  UA Dipstick w/o Micro (manual) [81002] 3)  T- * Misc. Laboratory test [phone]

## 2011-01-25 NOTE — Letter (Signed)
Summary: *HSN Results Follow up  [facility]  [address] [place], [place] [address]   Phone: [phone]  Fax: [phone]      06/24/2010   [facility] [address] [place], [place]  [address]   Dear  Laura Taylor,                            ____S.Drinkard,FNP   ____D. Gore,FNP       ____B. McPherson,MD   ____V. Rankins,MD    ____E. Mulberry,MD    ____N. Daphine Deutscher, FNP  ____D. Reche Dixon, MD    ____K. Philipp Deputy, MD    __x__S. Alben Spittle, PA-C     This letter is to inform you that your recent test(s):  _______Pap Smear    _______Lab Test     _______X-ray    _______ is within acceptable limits  _______ requires a medication change  _______ requires a follow-up lab visit  _______ requires a follow-up visit with your provider   Comments: Stool test negative for blood.       _________________________________________________________ If you have any questions, please contact our office                     Sincerely,  Tereso Newcomer PA-C [facility]

## 2011-01-25 NOTE — Letter (Signed)
Summary: Previsit letter  [facility]  [address] [place], [place] [address]   Phone: [phone]  Fax: [phone]       07/19/2010 MRN: [id number]  [facility] [address] [place], [place]  [address]  Dear Ms. [facility],  Welcome to the Gastroenterology Division at [facility].    You are scheduled to see a nurse for your pre-procedure visit on 08-25-10 at 9:00a.m. on the 3rd floor at [facility], [address]  We ask that you try to arrive at our office 15 minutes prior to your appointment time to allow for check-in.  Your nurse visit will consist of discussing your medical and surgical history, your immediate family medical history, and your medications.    Please bring a complete list of all your medications or, if you prefer, bring the medication bottles and we will list them.  We will need to be aware of both prescribed and over the counter drugs.  We will need to know exact dosage information as well.  If you are on blood thinners (Coumadin, Plavix, Aggrenox, Ticlid, etc.) please call our office today/prior to your appointment, as we need to consult with your physician about holding your medication.   Please be prepared to read and sign documents such as consent forms, a financial agreement, and acknowledgement forms.  If necessary, and with your consent, a friend or relative is welcome to sit-in on the nurse visit with you.  Please bring your insurance card so that we may make a copy of it.  If your insurance requires a referral to see a specialist, please bring your referral form from your primary care physician.  No co-pay is required for this nurse visit.     If you cannot keep your appointment, please call [phone] to cancel or reschedule prior to your appointment date.  This allows [place] the opportunity to schedule an appointment for another patient in need of care.    Thank you for choosing [facility] for your medical needs.  We appreciate the opportunity to care for you.  Please visit [place] at our website  to learn more about our practice.                     Sincerely.                                                                                                                   The Gastroenterology Division

## 2011-01-25 NOTE — Progress Notes (Signed)
  Phone Note Outgoing Call   Summary of Call: Rec'd refill request from our pharm. for pravastatin. Last refill indicates she was getting from [facility].  Please confirm that she is getting it from [facility].  If so, she should have plenty of refills.  Initial call taken by: Brynda Rim,  January 18, 2010 8:25 AM  Follow-up for Phone Call        pt goes to [facility] pharmacy Follow-up by: Armenia Shannon,  January 25, 2010 11:36 AM    Prescriptions: PRAVASTATIN SODIUM 20 MG TABS (PRAVASTATIN SODIUM) Take 1 tablet by mouth once a day for cholesterol  #30 x 5   Entered and Authorized by:   Tereso Newcomer PA-C   Signed by:   Tereso Newcomer PA-C on 01/25/2010   Method used:   Faxed to ...       [facility] - [facility] (retail)       [address] [place], [place]  [address]       Ph: [phone]       Fax: [phone]   RxID:   [id number]

## 2011-01-25 NOTE — Progress Notes (Signed)
Summary: PT referral  Phone Note Outgoing Call   Summary of Call: Refer to PT for shoulder pain  Initial call taken by: Brynda Rim,  August 25, 2010 10:17 PM  Follow-up for Phone Call        *PT HAVE AN APPT 09-13-10 @ 1:30PM .[facility] AWARE OF HER APPT Follow-up by: Cheryll Dessert,  September 01, 2010 11:42 AM       Impression & Recommendations:  Problem # 1:  OSTEOARTHRITIS, SHOULDER, RIGHT (ICD-715.91)  Orders: Physical Therapy Referral (PT)  Complete Medication List: 1)  Lisinopril 10 Mg Tabs (Lisinopril) .... Tomar 1 tableta cada manana (take one tablet every morning) 2)  Zoloft 50 Mg Tabs (Sertraline hcl) .... Tomar 1 tableta antes de acostarse (take one tablet at bedtime) 3)  Metrogel 1 % Gel (Metronidazole) .... Apply thin film to facial rash each day 4)  Pravastatin Sodium 20 Mg Tabs (Pravastatin sodium) .... Take 1 tablet by mouth once a day for cholesterol 5)  Fish Oil 1000 Mg Caps (Omega-3 fatty acids) .... Take 2 capsules by mouth once a day 6)  Flonase 50 Mcg/act Susp (Fluticasone propionate) .... 2 sprays each nostril once daily 7)  Meclizine Hcl 25 Mg Tabs (Meclizine hcl) .... 1/2 tab by mouth every 12 hours as needed for dizziness 8)  Miralax Powd (Polyethylene glycol 3350) .... As per prep  instructions. 9)  Dulcolax 5 Mg Tbec (Bisacodyl) .... Day before procedure take 2 at 3pm and 2 at 8pm. 10)  Reglan 10 Mg Tabs (Metoclopramide hcl) .... As per prep instructions.

## 2011-01-25 NOTE — Letter (Signed)
Summary: TRANSTHORACIC ECHOCARDIOGRAPHY  TRANSTHORACIC ECHOCARDIOGRAPHY   Imported By: Arta Bruce 08/11/2010 11:09:52  _____________________________________________________________________  External Attachment:    Type:   Image     Comment:   External Document

## 2011-01-25 NOTE — Assessment & Plan Note (Signed)
Summary: *DIZZINESS/ HEADACHE/ GK   Vital Signs:  Patient profile:   77 year old female Height:      62 inches Weight:      138 pounds BMI:     25.33 Temp:     98.0 degrees F oral Pulse rate:   68 / minute Pulse (ortho):   76 / minute Pulse rhythm:   regular Resp:     18 per minute BP sitting:   133 / 65  (left arm) BP standing:   130 / 60 Cuff size:   regular  Vitals Entered By: Armenia Shannon (June 25, 2010 9:38 AM)  Serial Vital Signs/Assessments:  Time      Position  BP       Pulse  Resp  Temp     By 9:46 AM   Standing  130/60   76                    Jeffre Enriques PA-C  CC: dizzness and headaches.... they spoke with dr in [place] and said can we check her thyroid... Is Patient Diabetic? No Pain Assessment Patient in pain? no       Does patient need assistance? Functional Status Self care Ambulation Normal   Primary Care Provider:  Tereso Newcomer PA-C  CC:  dizzness and headaches.... they spoke with dr in [place] and said can we check her thyroid....  History of Present Illness: Here for dizziness.  Denies having any headaches.  She woke up 2 days ago with dizziness.  Does not describe as spinning.  Feels lightheaded.  Feels like she will fall.  Has not passed out.  No vomiting.  Feels scared when she has episode.  She notes heart racing after dizziness has started.  Lasts about 1-2 hours.  Does not lean to one particular side or the other.  No difficulty with speech.  No unilateral weaness.  Does not relate to certain head positions.  Cannot reproduce on her own.  Had a URI a couple weeks ago. Still has scant cough.  Whitish sputum.  No fevers.  Does have chills.  No congestion.  No chest pain.  No dyspnea.  No PND or orthopnea.    Problems Prior to Update: 1)  Systolic Murmur  (ICD-785.2) 2)  Dizziness  (ICD-780.4) 3)  Glucose Intolerance  (ICD-271.3) 4)  Thyroid Nodule  (ICD-241.0) 5)  Urinalysis, Abnormal  (ICD-791.9) 6)  Abdominal Pain Other Specified Site   (ICD-789.09) 7)  Hyperlipidemia  (ICD-272.4) 8)  Screening For Mlig Neop, Breast, Nos  (ICD-V76.10) 9)  Screening For Malignant Neoplasm, Cervix  (ICD-V76.2) 10)  Examination, Routine Medical  (ICD-V70.0) 11)  Back Pain, Thoracic Region, Left  (ICD-724.1) 12)  Essential Hypertension  (ICD-401.9) 13)  Rosacea  (ICD-695.3) 14)  Dental Caries  (ICD-521.00)  Current Medications (verified): 1)  Lisinopril 10 Mg Tabs (Lisinopril) .... Tomar 1 Tableta Cada Manana (Take One Tablet Every Morning) 2)  Zoloft 50 Mg Tabs (Sertraline Hcl) .... Tomar 1 Tableta Antes De Acostarse (Take One Tablet At Bedtime) 3)  Metrogel 1 %  Gel (Metronidazole) .... Apply Thin Film To Facial Rash Each Day 4)  Pravastatin Sodium 20 Mg Tabs (Pravastatin Sodium) .... Take 1 Tablet By Mouth Once A Day For Cholesterol 5)  Fish Oil 1000 Mg Caps (Omega-3 Fatty Acids) .... Take 2 Capsules By Mouth Once A Day  Allergies (verified): 1)  ! Penicillin  Past History:  Past Medical History: Last updated: 05/27/2008 h/o right  thyroid nodule;s/p biopsy 11/16/07. Had f/u ENT visit(Dr.Crossley) 04/24/2007. No further f/u needed. Chronic Depression/Anxiety h/o unopposed estrogen use 05/29/01-10/10/01. Pt has NO CERVIX but does have a uterus. Had a hysteroscopy with endometrial biopsy(GYN clinic 12/10/01). Bx benign.  Past Surgical History: Last updated: 05/27/2008 s/p Cervical surgery in [place](unknown reason for cervix removal)....distant past.   Family History: Last updated: 06/14/2010 No colon or breast cancer. No CAD, HTN, DM.  Social History: Last updated: 06/14/2010 Retired/unemployed Lives with daughter. Has daughter who is pediatrician in tertiary hospital in [place]. Never Smoked Alcohol use-no Drug use-no  Review of Systems      See HPI Eyes:  Denies vision loss-both eyes. CV:  See HPI. Resp:  See HPI. GI:  Denies bloody stools and dark tarry stools. GU:  Denies hematuria. Neuro:  See HPI.  Physical  Exam  General:  alert, well-developed, and well-nourished.   Head:  normocephalic and atraumatic.   Eyes:  pupils equal, pupils round, and pupils reactive to light.   Ears:  R ear normal and L ear normal.   Nose:  no external deformity.   Mouth:  pharynx pink and moist.   Neck:  no carotid bruits.   Lungs:  normal breath sounds.   Heart:  normal rate and regular rhythm.   2/6 systolic murmur along LSB  Abdomen:  soft, non-tender, normal bowel sounds, and no hepatomegaly.   Msk:  normal ROM.   Pulses:  R posterior tibial normal, R dorsalis pedis normal, L posterior tibial normal, and L dorsalis pedis normal.   Extremities:  no edema  Neurologic:  alert & oriented X3 and cranial nerves II-XII intact.   widened gait stance; slow to ambulate (daughter states gait appears abnormal) strength normal and equal in BUE/BLE negative Rhomberg patient did not understand instructions for heel to shin, but seemed to have no deficits Skin:  turgor normal.   Psych:  normally interactive and good eye contact.     Impression & Recommendations:  Problem # 1:  DIZZINESS (ICD-780.4)  etiology not clear to me description does not entirely sound like vertigo did have URI couple weeks ago . . . could have subclinical sinusitis recent labs all normal with recent CPP (hgb, cmet, tsh) discussed with Dr. Delrae Alfred will initially treat for sinusitis zpak, nasocort, meclizine as needed  has change in gait (per daughter) reassess in 2 weeks if symptoms continue, consider MRI/MRA EKG ok . . . no symptoms at this time to think arrhythmogenic, but could consider holter as well may ultimately need therapy eval if sounds more and more like vertigo  does have murmur will check echo  Her updated medication list for this problem includes:    Meclizine Hcl 25 Mg Tabs (Meclizine hcl) .Marland Kitchen... 1/2 tab by mouth every 12 hours as needed for dizziness  Orders: 2 D Echo (2 D Echo)  Problem # 2:  SYSTOLIC MURMUR  (ICD-785.2)  Orders: 2 D Echo (2 D Echo)  Complete Medication List: 1)  Lisinopril 10 Mg Tabs (Lisinopril) .... Tomar 1 tableta cada manana (take one tablet every morning) 2)  Zoloft 50 Mg Tabs (Sertraline hcl) .... Tomar 1 tableta antes de acostarse (take one tablet at bedtime) 3)  Metrogel 1 % Gel (Metronidazole) .... Apply thin film to facial rash each day 4)  Pravastatin Sodium 20 Mg Tabs (Pravastatin sodium) .... Take 1 tablet by mouth once a day for cholesterol 5)  Fish Oil 1000 Mg Caps (Omega-3 fatty acids) .... Take 2 capsules by  mouth once a day 6)  Zithromax Z-pak 250 Mg Tabs (Azithromycin) .... As directed 7)  Flonase 50 Mcg/act Susp (Fluticasone propionate) .... 2 spray in each nostril for 3 weeks, then use every other day for 2 days and stop 8)  Meclizine Hcl 25 Mg Tabs (Meclizine hcl) .... 1/2 tab by mouth every 12 hours as needed for dizziness  Patient Instructions: 1)  Take Zpak until gone. 2)  Use Flonase as directed. 3)  Only use the meclizine if you have dizziness. Do not use more than two times a day.  If you are not dizzy, do not use. 4)  Please schedule a follow-up appointment in 2 weeks with Theda Payer for dizziness. 5)  Go to the emergency room if you feel any worse, have difficulty speaking, cannot move one side of your body or your face is drooping.  Prescriptions: MECLIZINE HCL 25 MG TABS (MECLIZINE HCL) 1/2 tab by mouth every 12 hours as needed for dizziness  #20 x 0   Entered and Authorized by:   Tereso Newcomer PA-C   Signed by:   Tereso Newcomer PA-C on 06/25/2010   Method used:   Print then Give to Patient   RxID:   [id number] FLONASE 50 MCG/ACT SUSP (FLUTICASONE PROPIONATE) 2 spray in each nostril for 3 weeks, then use every other day for 2 days and stop  #1 x 0   Entered and Authorized by:   Tereso Newcomer PA-C   Signed by:   Tereso Newcomer PA-C on 06/25/2010   Method used:   Print then Give to Patient   RxID:   [id number] ZITHROMAX Z-PAK 250 MG TABS  (AZITHROMYCIN) as directed  #1 pack x 0   Entered and Authorized by:   Tereso Newcomer PA-C   Signed by:   Tereso Newcomer PA-C on 06/25/2010   Method used:   Print then Give to Patient   RxID:   [id number]    EKG  Procedure date:  06/25/2010  Findings:      Normal sinus rhythm with rate of:  75 normal axis no ischemic changes normal intervals

## 2011-01-25 NOTE — Assessment & Plan Note (Signed)
Summary: cpp exam//gk   Vital Signs:  Patient profile:   77 year old female Height:      62 inches Weight:      139 pounds BMI:     25.52 Temp:     97.9 degrees F oral Pulse rate:   64 / minute Pulse rhythm:   regular Resp:     18 per minute BP sitting:   111 / 63  (left arm) Cuff size:   regular  Vitals Entered By: Armenia Shannon (June 14, 2010 10:33 AM)  Primary Care Provider:  Tereso Newcomer PA-C   History of Present Illness: Here for CPP.  Health Maint: ? h/o cancer on cervix . .  . had surgery in [place] vaginal smear done last year . . . poss repeat in 2-3 years No vaginal bleeding or discharge. Mammo done last month. No breast or ovarian cancer.  No colon cancer. PHQ9=0 today.    Problems Prior to Update: 1)  Glucose Intolerance  (ICD-271.3) 2)  Thyroid Nodule  (ICD-241.0) 3)  Urinalysis, Abnormal  (ICD-791.9) 4)  Abdominal Pain Other Specified Site  (ICD-789.09) 5)  Hyperlipidemia  (ICD-272.4) 6)  Screening For Mlig Neop, Breast, Nos  (ICD-V76.10) 7)  Screening For Malignant Neoplasm, Cervix  (ICD-V76.2) 8)  Examination, Routine Medical  (ICD-V70.0) 9)  Back Pain, Thoracic Region, Left  (ICD-724.1) 10)  Essential Hypertension  (ICD-401.9) 11)  Rosacea  (ICD-695.3) 12)  Dental Caries  (ICD-521.00)  Allergies: 1)  ! Penicillin  Past History:  Past Medical History: Last updated: 05/27/2008 h/o right thyroid nodule;s/p biopsy 11/16/07. Had f/u ENT visit(Dr.Crossley) 04/24/2007. No further f/u needed. Chronic Depression/Anxiety h/o unopposed estrogen use 05/29/01-10/10/01. Pt has NO CERVIX but does have a uterus. Had a hysteroscopy with endometrial biopsy(GYN clinic 12/10/01). Bx benign.  Past Surgical History: Last updated: 05/27/2008 s/p Cervical surgery in [place](unknown reason for cervix removal)....distant past.   Family History: No colon or breast cancer. No CAD, HTN, DM.  Social History: Retired/unemployed Lives with daughter. Has daughter who  is pediatrician in tertiary hospital in [place]. Never Smoked Alcohol use-no Drug use-no  Review of Systems      See HPI General:  Denies chills and fever. ENT:  Complains of ringing in ears. CV:  Denies chest pain or discomfort and shortness of breath with exertion. Resp:  Denies cough. GI:  Denies bloody stools and dark tarry stools. GU:  Denies dysuria and nocturia. MS:  Denies joint pain. Neuro:  Denies falling down and sensation of room spinning. Psych:  Denies depression. Heme:  Denies bleeding.  Physical Exam  General:  alert, well-developed, and well-nourished.   Head:  normocephalic and atraumatic.   Eyes:  pupils equal, pupils round, pupils reactive to light, and no optic disk abnormalities.   Ears:  R ear normal and L ear normal.   Nose:  no external deformity.   Mouth:  pharynx pink and moist, no erythema, and no exudates.   Neck:  supple, no carotid bruits, and no cervical lymphadenopathy.   ? thyroid nodule right lobe Breasts:  skin/areolae normal, no masses, no abnormal thickening, no nipple discharge, no tenderness, and no adenopathy.   Lungs:  normal breath sounds, no crackles, and no wheezes.   Heart:  normal rate, regular rhythm, and no murmur.   Abdomen:  soft, non-tender, normal bowel sounds, and no hepatomegaly.   Rectal:  deferred Genitalia:  deferred  Msk:  normal ROM.   Pulses:  R posterior tibial normal, R dorsalis pedis  normal, L posterior tibial normal, and L dorsalis pedis normal.   Extremities:  no edema  Neurologic:  alert & oriented X3, cranial nerves II-XII intact, strength normal in all extremities, and DTRs symmetrical and normal.   Skin:  turgor normal.   Psych:  normally interactive and good eye contact.     Impression & Recommendations:  Problem # 1:  THYROID NODULE (ICD-241.0)  get u/s TFTs normal recently  Orders: Ultrasound (Ultrasound)  Problem # 2:  URINALYSIS, ABNORMAL (ICD-791.9)  Orders: T-Culture, Urine  (65784-69629) T- * Misc. Laboratory test [phone])  Problem # 3:  HYPERLIPIDEMIA (ICD-272.4)  get FLP  Her updated medication list for this problem includes:    Pravastatin Sodium 20 Mg Tabs (Pravastatin sodium) .Marland Kitchen... Take 1 tablet by mouth once a day for cholesterol  Orders: T-Lipid Profile (32440-10272)  Problem # 4:  EXAMINATION, ROUTINE MEDICAL (ICD-V70.0) mammo done does not need pap send for dexa discussed colo and she wants to proceed  Orders: T-Hemoccult Cards-Multiple (82270) Dexa scan (Dexa scan) T-Lipid Profile (53664-40347) UA Dipstick w/o Micro (manual) (42595) Gastroenterology Referral (GI)  Problem # 5:  ABDOMINAL PAIN OTHER SPECIFIED SITE (ICD-789.09) Assessment: Improved patient denies any further symptoms  Problem # 6:  GLUCOSE INTOLERANCE (ICD-271.3)  A1C 5.9 will watch this give diet infor  Orders: Capillary Blood Glucose/CBG (82948) Hemoglobin A1C (83036)  Complete Medication List: 1)  Lisinopril 10 Mg Tabs (Lisinopril) .... Tomar 1 tableta cada manana (take one tablet every morning) 2)  Zoloft 50 Mg Tabs (Sertraline hcl) .... Tomar 1 tableta antes de acostarse (take one tablet at bedtime) 3)  Metrogel 1 % Gel (Metronidazole) .... Apply thin film to facial rash each day 4)  Pravastatin Sodium 20 Mg Tabs (Pravastatin sodium) .... Take 1 tablet by mouth once a day for cholesterol 5)  Fish Oil 1000 Mg Caps (Omega-3 fatty acids) .... Take 1 capsule by mouth once a day  Patient Instructions: 1)  Take calcium +vitamin D daily.  2)  Please schedule a follow-up appointment in 6 months with Maki Sweetser for blood pressure. 3)  Someone will call you to set up the colonoscopy. 4)     Laboratory Results   Urine Tests    Routine Urinalysis   Glucose: negative   (Normal Range: Negative) Bilirubin: negative   (Normal Range: Negative) Ketone: negative   (Normal Range: Negative) Spec. Gravity: >=1.030   (Normal Range: 1.003-1.035) Blood: trace-intact    (Normal Range: Negative) pH: 5.5   (Normal Range: 5.0-8.0) Protein: negative   (Normal Range: Negative) Urobilinogen: 0.2   (Normal Range: 0-1) Nitrite: negative   (Normal Range: Negative) Leukocyte Esterace: trace   (Normal Range: Negative)         Mammogram  Procedure date:  05/20/2010  Findings:      No specific mammographic evidence of malignancy.  Assessment: BIRADS 1.   Comments:      Screening mammogram in 1 year.     Appended Document: cpp exam//gk     Allergies: 1)  ! Penicillin   Complete Medication List: 1)  Lisinopril 10 Mg Tabs (Lisinopril) .... Tomar 1 tableta cada manana (take one tablet every morning) 2)  Zoloft 50 Mg Tabs (Sertraline hcl) .... Tomar 1 tableta antes de acostarse (take one tablet at bedtime) 3)  Metrogel 1 % Gel (Metronidazole) .... Apply thin film to facial rash each day 4)  Pravastatin Sodium 20 Mg Tabs (Pravastatin sodium) .... Take 1 tablet by mouth once a day for cholesterol 5)  Fish Oil 1000 Mg Caps (Omega-3 fatty acids) .... Take 2 capsules by mouth once a day   Laboratory Results  Date/Time Received: June 24, 2010 5:52 PM   Stool - Occult Blood Hemmoccult #1: negative Date: 06/24/2010 Hemoccult #2: negative Date: 06/24/2010 Hemoccult #3: negative Date: 06/24/2010

## 2011-01-25 NOTE — Letter (Signed)
Summary: RADIOLOGY/ORDER  RADIOLOGY/ORDER   Imported By: Arta Bruce 07/14/2010 09:29:09  _____________________________________________________________________  External Attachment:    Type:   Image     Comment:   External Document

## 2011-01-25 NOTE — Miscellaneous (Signed)
Summary: Rehab Report//DISCHARGE SUMMARY  Rehab Report//DISCHARGE SUMMARY   Imported By: Arta Bruce 10/29/2010 12:00:26  _____________________________________________________________________  External Attachment:    Type:   Image     Comment:   External Document

## 2011-01-25 NOTE — Letter (Signed)
Summary: [facility] Instructions  [facility]  [address] [place], [place] [address]   Phone: [phone]  Fax: [phone]       Laura Taylor    Jan 22, 1933    MRN: [id number]       Procedure Day Dorna Bloom:  Wednesday 09/08/2010     Arrival Time: 10:30 am     Procedure Time: 11:30 am     Location of Procedure:                    _ x_  [facility] (4th Floor)    PREPARATION FOR COLONOSCOPY WITH MIRALAX  Starting 5 days prior to your procedure Friday 9/9 do not eat nuts, seeds, popcorn, corn, beans, peas,  salads, or any raw vegetables.  Do not take any fiber supplements (e.g. Metamucil, Citrucel, and Benefiber). ____________________________________________________________________________________________________   THE DAY BEFORE YOUR PROCEDURE         DATE: Tuesday 9/13  1   Drink clear liquids the entire day-NO SOLID FOOD  2   Do not drink anything colored red or purple.  Avoid juices with pulp.  No orange juice.  3   Drink at least 64 oz. (8 glasses) of fluid/clear liquids during the day to prevent dehydration and help the prep work efficiently.  CLEAR LIQUIDS INCLUDE: Water Jello Ice Popsicles Tea (sugar ok, no milk/cream) Powdered fruit flavored drinks Coffee (sugar ok, no milk/cream) Gatorade Juice: apple, white grape, white cranberry  Lemonade Clear bullion, consomm, broth Carbonated beverages (any kind) Strained chicken noodle soup Hard Candy  4   Mix the entire bottle of Miralax with 64 oz. of Gatorade/Powerade in the morning and put in the refrigerator to chill.  5   At 3:00 pm take 2 Dulcolax/Bisacodyl tablets.  6   At 4:30 pm take one Reglan/Metoclopramide tablet.  7  Starting at 5:00 pm drink one 8 oz glass of the Miralax mixture every 15-20 minutes until you have finished drinking the entire 64 oz.  You should finish drinking prep around 7:30 or 8:00 pm.  8   If you are nauseated, you may take the 2nd  Reglan/Metoclopramide tablet at 6:30 pm.        9    At 8:00 pm take 2 more DULCOLAX/Bisacodyl tablets.     THE DAY OF YOUR PROCEDURE      DATE:  Wednesday 9/14  You may drink clear liquids until 9:30 am  (2 HOURS BEFORE PROCEDURE).   MEDICATION INSTRUCTIONS  Unless otherwise instructed, you should take regular prescription medications with a small sip of water as early as possible the morning of your procedure.           OTHER INSTRUCTIONS  You will need a responsible adult at least 75 years of age to accompany you and drive you home.   This person must remain in the waiting room during your procedure.  Wear loose fitting clothing that is easily removed.  Leave jewelry and other valuables at home.  However, you may wish to bring a book to read or an iPod/MP3 player to listen to music as you wait for your procedure to start.  Remove all body piercing jewelry and leave at home.  Total time from sign-in until discharge is approximately 2-3 hours.  You should go home directly after your procedure and rest.  You can resume normal activities the day after your procedure.  The day of your procedure you should not:   Drive  Make legal decisions   Operate machinery   Drink alcohol   Return to work  You will receive specific instructions about eating, activities and medications before you leave.   The above instructions have been reviewed and explained to me by   Ezra Sites RN  August 25, 2010 9:48 AM     I fully understand and can verbalize these instructions _____________________________ Date _______

## 2011-01-25 NOTE — Letter (Signed)
Summary: *HSN Results Follow up  [facility]  [address] [place], [place] [address]   Phone: [phone]  Fax: [phone]      11/12/2010   [facility] [address] [place], [place]  [address]   Dear  Ms. Laura Taylor,                            ____S.Drinkard,FNP   ____D. Gore,FNP       ____B. McPherson,MD   ____V. Rankins,MD    ____E. Mulberry,MD    __X__N. Daphine Deutscher, FNP  ____D. Reche Dixon, MD    ____K. Philipp Deputy, MD    ____Other     This letter is to inform you that your recent test(s):  _______Pap Smear    ___X____Lab Test     _______X-ray    ___X____ is within acceptable limits  _______ requires a medication change  _______ requires a follow-up lab visit  _______ requires a follow-up visit with your provider   Comments:  Labs done during recent office visit are normal.       _________________________________________________________ If you have any questions, please contact our office [phone].                    Sincerely,    Lehman Prom FNP [facility]

## 2011-01-25 NOTE — Letter (Signed)
Summary: *HSN Results Follow up  [facility]  [address] [place], [place] [address]   Phone: [phone]  Fax: [phone]      04/15/2010   [facility] [address] [place], [place]  [address]   Dear  Ms. Laura Taylor,                            ____S.Drinkard,FNP   ____D. Gore,FNP       ____B. McPherson,MD   ____V. Rankins,MD    ____E. Mulberry,MD    ____N. Daphine Deutscher, FNP  ____D. Reche Dixon, MD    ____K. Philipp Deputy, MD    __x__S. Alben Spittle, PA-C     This letter is to inform you that your recent test(s):  _______Pap Smear    ___x____Lab Test     _______X-ray    ___x____ is within acceptable limits  _______ requires a medication change  _______ requires a follow-up lab visit  _______ requires a follow-up visit with your provider   Comments:       _________________________________________________________ If you have any questions, please contact our office                     Sincerely,  Laura Newcomer PA-C [facility]

## 2011-01-25 NOTE — Assessment & Plan Note (Signed)
Summary: fu per Donta Fuster///kt   Vital Signs:  Patient profile:   77 year old female Height:      62 inches Weight:      137 pounds BMI:     25.15 Temp:     97.9 degrees F oral Pulse rate:   70 / minute Pulse rhythm:   regular Resp:     18 per minute BP sitting:   135 / 64  (left arm) Cuff size:   regular  Vitals Entered By: [place] Shannon (July 07, 2010 10:07 AM) CC: f/u.... pt says she still has poping in her ears.... Is Patient Diabetic? No Pain Assessment Patient in pain? no       Does patient need assistance? Functional Status Self care Ambulation Normal   Primary Care Provider:  Tereso Newcomer PA-C  CC:  f/u.... pt says she still has poping in her ears.....  History of Present Illness: Here for follow up on dizziness. Completed zpak and using flonase.  Overall feels better.   Still gets dizzy at times.  Using meclizine as needed.  It helps but makes her drowsy. Notes episode of dizziness 5 years ago in [place] similar to this. She still feels "popping" sensation in her right ear.  Better with flonase. Gets echo later in week. Does still note trouble with balance.  Stable over about 6 mos.  Larey Seat out of bed once, otherwise no h/o falls.   She feels like she leans to the right.  She does note dizziness with turning to the left in bed. Still denies spinning sensation.  Describes lightheadedness. No syncope.   Current Medications (verified): 1)  Lisinopril 10 Mg Tabs (Lisinopril) .... Tomar 1 Tableta Cada Manana (Take One Tablet Every Morning) 2)  Zoloft 50 Mg Tabs (Sertraline Hcl) .... Tomar 1 Tableta Antes De Acostarse (Take One Tablet At Bedtime) 3)  Metrogel 1 %  Gel (Metronidazole) .... Apply Thin Film To Facial Rash Each Day 4)  Pravastatin Sodium 20 Mg Tabs (Pravastatin Sodium) .... Take 1 Tablet By Mouth Once A Day For Cholesterol 5)  Fish Oil 1000 Mg Caps (Omega-3 Fatty Acids) .... Take 2 Capsules By Mouth Once A Day 6)  Flonase 50 Mcg/act Susp (Fluticasone  Propionate) .... 2 Spray in Each Nostril For 3 Weeks, Then Use Every Other Day For 2 Days and Stop 7)  Meclizine Hcl 25 Mg Tabs (Meclizine Hcl) .... 1/2 Tab By Mouth Every 12 Hours As Needed For Dizziness  Allergies (verified): 1)  ! Penicillin  Physical Exam  General:  alert, well-developed, and well-nourished.   Head:  normocephalic and atraumatic.   Ears:  R ear normal and L ear normal.   Mouth:  pharynx pink and moist.   Neck:  supple.   Lungs:  normal breath sounds.   Heart:  normal rate and regular rhythm.   2/6 systolic murmur along LSB  Neurologic:  alert & oriented X3, cranial nerves II-XII intact, and toes down bilaterally on Babinski.   Psych:  normally interactive.     Impression & Recommendations:  Problem # 1:  DIZZINESS (ICD-780.4)  I suspect she may have vertigo had similar symptoms 5 years ago does bring on by turning to the left meclizine helps when she takes hx is s/w difficult will refer to Neuro PT for balance and vest rehab  does still have some eustachian tube dysfxn symptoms will continue nasal steroid for 2-3 mos  Kaileia Maldonado-altamirano's daughter worried about labyrinthitis.  Do not think it is  this.   Her updated medication list for this problem includes:    Meclizine Hcl 25 Mg Tabs (Meclizine hcl) .Marland Kitchen... 1/2 tab by mouth every 12 hours as needed for dizziness  Orders: Physical Therapy Referral (PT)  Problem # 2:  ABNORMALITY OF GAIT (ICD-781.2)  has noted some imbalance ? if related to above she did have abnormal gait observed at last visit will have her see PT for both  Orders: Physical Therapy Referral (PT)  Complete Medication List: 1)  Lisinopril 10 Mg Tabs (Lisinopril) .... Tomar 1 tableta cada manana (take one tablet every morning) 2)  Zoloft 50 Mg Tabs (Sertraline hcl) .... Tomar 1 tableta antes de acostarse (take one tablet at bedtime) 3)  Metrogel 1 % Gel (Metronidazole) .... Apply thin film to facial rash each day 4)   Pravastatin Sodium 20 Mg Tabs (Pravastatin sodium) .... Take 1 tablet by mouth once a day for cholesterol 5)  Fish Oil 1000 Mg Caps (Omega-3 fatty acids) .... Take 2 capsules by mouth once a day 6)  Flonase 50 Mcg/act Susp (Fluticasone propionate) .... 2 sprays each nostril once daily 7)  Meclizine Hcl 25 Mg Tabs (Meclizine hcl) .... 1/2 tab by mouth every 12 hours as needed for dizziness  Patient Instructions: 1)  Continue taking the Flonase nasal spray for 2-3 months.  You may taper off when you are no longer having any symptoms of ear popping anymore. 2)  Someone will call you to set up a referral to Physical Therapy.  If you do not get a call in 2 weeks, call me back. 3)  Schedule follow up in 6 weeks if dizziness is not better. 4)  Otherwise, follow up with Chelsae Zanella in 6 months for blood pressure. Prescriptions: FLONASE 50 MCG/ACT SUSP (FLUTICASONE PROPIONATE) 2 sprays each nostril once daily  #1 x 3   Entered and Authorized by:   Tereso Newcomer PA-C   Signed by:   Tereso Newcomer PA-C on 07/07/2010   Method used:   Faxed to ...       [facility] (retail)       [address] [place], [place]  [address]       Ph: [phone]       Fax: [phone]   RxID:   [id number]

## 2011-01-25 NOTE — Miscellaneous (Signed)
  Clinical Lists Changes  Observations: Added new observation of PAST MED HX: h/o right thyroid nodule;s/p biopsy 11/16/07. Had f/u ENT visit(Dr.Crossley) 04/24/2007. No further f/u needed.   a.  repeat u/s 06/2010: stable ovoid mass superior to right superior lobe of thyroid felt to be benign Chronic Depression/Anxiety h/o unopposed estrogen use 05/29/01-10/10/01. Pt has NO CERVIX but does have a uterus. Had a hysteroscopy with endometrial biopsy(GYN clinic 12/10/01). Bx benign. Echo 06/2010:  EF 60%; aortic sclerosis without stenosis; PA peak pressure 43 mmHg DEXA 05/2010:  Osteopenia; FRAX: 10 yr - 6%; Hip fx 1% Colo 08/2010:  polyp (tubular adenoma); mild diverticulosis; needs repeat colo in 2016 (09/15/2010 8:38)        Past History:  Past Medical History: h/o right thyroid nodule;s/p biopsy 11/16/07. Had f/u ENT visit(Dr.Crossley) 04/24/2007. No further f/u needed.   a.  repeat u/s 06/2010: stable ovoid mass superior to right superior lobe of thyroid felt to be benign Chronic Depression/Anxiety h/o unopposed estrogen use 05/29/01-10/10/01. Pt has NO CERVIX but does have a uterus. Had a hysteroscopy with endometrial biopsy(GYN clinic 12/10/01). Bx benign. Echo 06/2010:  EF 60%; aortic sclerosis without stenosis; PA peak pressure 43 mmHg DEXA 05/2010:  Osteopenia; FRAX: 10 yr - 6%; Hip fx 1% Colo 08/2010:  polyp (tubular adenoma); mild diverticulosis; needs repeat colo in 2016

## 2011-01-25 NOTE — Progress Notes (Signed)
Summary: Glucosamine-Chondroitin dosage  Phone Note Call from Patient   Summary of Call: PLEASE CALL PATIENT SHE WANTS TO KNOW ABOUT WHAT MEDINE SHE CAN TAKE BECAUSE THE PHARMACY THEY DONT HAVE THE MEDICE CLOCOSAMINE-CHONDROETEN 500-250 MLGRS THEY ONLY HAVE 1500-200, PLEASE CALL [phone] Initial call taken by: Domenic Polite,  November 15, 2010 10:41 AM  Follow-up for Phone Call        Pt  found Glucosamine-Chondroitin 1500-200 mg at [facility] and [facility] cost $35.00 for 120 tablets. [facility] carries 500--400 $21.99 for 120 tablets. You had ordered 500-250 unable to find, could she take a different doseage if so what? Follow-up by: Gaylyn Cheers RN,  November 15, 2010 12:18 PM  Additional Follow-up for Phone Call Additional follow up Details #1::        yes, pt can take the glucosamine 500/400mg  by mouth three times a day. so she can get the meds from [facility] or wherever cheaper - she may try also [facility] as long as it is the dosage Additional Follow-up by: Lehman Prom FNP,  November 15, 2010 3:45 PM    Additional Follow-up for Phone Call Additional follow up Details #2::    Pt. notified. Follow-up by: Gaylyn Cheers RN,  November 15, 2010 4:02 PM  New/Updated Medications: COSAMIN DS 500-400 MG TABS (GLUCOSAMINE-CHONDROITIN) One tablet by mouth three times a day

## 2011-01-25 NOTE — Progress Notes (Signed)
Summary: Office Visit//DEPRESSION SCREENING  Office Visit//DEPRESSION SCREENING   Imported By: Arta Bruce 07/07/2010 15:33:23  _____________________________________________________________________  External Attachment:    Type:   Image     Comment:   External Document

## 2011-01-25 NOTE — Letter (Signed)
Summary: Results Letter  [facility]  [address] [place], [place] [address]   Phone: [phone]  Fax: [phone]        September 10, 2010 MRN: [id number]    [facility] [address] [place], [place]  [address]    Dear [name],   The polyps that was removed during your recent procedure was proven to be adenomatous.  These are pre-cancerous polyps that may have grown into cancers if they had not been removed.  Based on current nationally recognized surveillance guidelines, I recommend that you have a repeat colonoscopy in 5 years.  We will therefore put your information in our reminder system and will contact you in 5 years to schedule a repeat procedure.  Please call if you have any questions or concerns.       Sincerely,  Rachael Fee MD  This letter has been electronically signed by your physician.  Appended Document: Results Letter letter mailed

## 2011-02-04 ENCOUNTER — Encounter: Payer: Self-pay | Admitting: Nurse Practitioner

## 2011-02-04 ENCOUNTER — Encounter (INDEPENDENT_AMBULATORY_CARE_PROVIDER_SITE_OTHER): Payer: Self-pay | Admitting: Nurse Practitioner

## 2011-02-04 DIAGNOSIS — J069 Acute upper respiratory infection, unspecified: Secondary | ICD-10-CM | POA: Insufficient documentation

## 2011-02-10 NOTE — Assessment & Plan Note (Signed)
Summary: Acute - URI   Vital Signs:  Patient profile:   77 year old female Menstrual status:  postmenopausal Weight:      143.3 pounds Temp:     97.1 degrees F oral Pulse rate:   72 / minute Pulse rhythm:   regular Resp:     16 per minute BP sitting:   140 / 70  (left arm) Cuff size:   regular  Vitals Entered By: Levon Hedger (February 04, 2011 2:58 PM) CC: headache and chest discomfort with cough with phlem x1 week, Hypertension Management, Lipid Management Is Patient Diabetic? No Pain Assessment Patient in pain? yes     Location: headache, stomach  Does patient need assistance? Functional Status Self care Ambulation Normal   Primary Care Provider:  Tereso Newcomer PA-C  CC:  headache and chest discomfort with cough with phlem x1 week, Hypertension Management, and Lipid Management.  History of Present Illness:  Pt into the office today with chest pain and headache. +cough present for 15 days +chest pain presumably due to cough - cough worse in the morning +shortness of breath +sore throat +headache  She has only taken tylenol for the symptoms.   [employer] present with pt during the visit - spanish  Anticoagulation Management History:      Positive risk factors for bleeding include an age of 30 years or older.  The bleeding index is 'intermediate risk'.  Positive CHADS2 values include History of HTN and Age > 85 years old.  Negative CHADS2 values include History of Diabetes.    Hypertension History:      She complains of headache and chest pain, but denies palpitations.  She notes no problems with any antihypertensive medication side effects.  pt is taking meds as ordered for BP.        Positive major cardiovascular risk factors include female age 76 years old or older, hyperlipidemia, and hypertension.  Negative major cardiovascular risk factors include no history of diabetes and non-tobacco-user status.    Lipid Management History:      Positive NCEP/ATP  III risk factors include female age 5 years old or older, HDL cholesterol less than 40, and hypertension.  Negative NCEP/ATP III risk factors include no history of early menopause without estrogen hormone replacement, non-diabetic, and non-tobacco-user status.        The patient states that she does not know about the "Therapeutic Lifestyle Change" diet.  Her compliance with the TLC diet is good.       Habits & Providers  Alcohol-Tobacco-Diet     Alcohol drinks/day: 0     Tobacco Status: never     Passive Smoke Exposure: no  Exercise-Depression-Behavior     Does Patient Exercise: yes     Drug Use: no     Seat Belt Use: 100     Sun Exposure: occasionally  Medications Prior to Update: 1)  Lisinopril 10 Mg Tabs (Lisinopril) .... Tomar 1 Tableta Cada Manana (Take One Tablet Every Morning) 2)  Zoloft 50 Mg Tabs (Sertraline Hcl) .... Tomar 1 Tableta Antes De Acostarse (Take One Tablet At Bedtime) 3)  Pravastatin Sodium 20 Mg Tabs (Pravastatin Sodium) .... Take 1 Tablet By Mouth Once A Day For Cholesterol 4)  Fish Oil 1000 Mg Caps (Omega-3 Fatty Acids) .... Take 2 Capsules By Mouth Once A Day 5)  Flonase 50 Mcg/act Susp (Fluticasone Propionate) .... 2 Sprays Each Nostril Once Daily 6)  Meclizine Hcl 25 Mg Tabs (Meclizine Hcl) .... 1/2 Tab By Mouth  Every 12 Hours As Needed For Dizziness 7)  Tylenol Extra Strength 500 Mg Tabs (Acetaminophen) .... One Tablet By Mouth Two Times A Day As Needed 8)  Diclofenac Sodium 75 Mg Tbec (Diclofenac Sodium) .... One Tablet By Mouth By Mouth Two Times A Day For 1 Week Then As Needed 9)  Cosamin Ds 500-400 Mg Tabs (Glucosamine-Chondroitin) .... One Tablet By Mouth Three Times A Day  Allergies (verified): 1)  ! Penicillin 2)  ! Asa  Review of Systems General:  Denies chills. ENT:  Complains of nasal congestion and sore throat; denies earache. CV:  Complains of chest pain or discomfort. Resp:  Complains of cough. GI:  Denies abdominal pain, nausea, and  vomiting. Neuro:  Complains of headaches.  Physical Exam  General:  alert.   Head:  normocephalic.   Eyes:  pupils round.   Ears:  ear piercing(s) noted.   Nose:  inflammed turbinate Lungs:  normal breath sounds.   Heart:  normal rate and regular rhythm.   Msk:  up the the exam table Neurologic:  alert & oriented X3.     Impression & Recommendations:  Problem # 1:  UPPER RESPIRATORY INFECTION (ICD-465.9) no signs of infection advise pt to drink plenty of water - tea given (no caffiene) pt to stay hydrated. cough meds as needed  Her updated medication list for this problem includes:    Tylenol Extra Strength 500 Mg Tabs (Acetaminophen) ..... One tablet by mouth two times a day as needed    Diclofenac Sodium 75 Mg Tbec (Diclofenac sodium) ..... One tablet by mouth by mouth two times a day for 1 week then as needed    Promethazine-dm 6.25-15 Mg/49ml Syrp (Promethazine-dm) ..... One teaspoon every 12 hours as needed for cough  Problem # 2:  ESSENTIAL HYPERTENSION (ICD-401.9) will monitor and start pt on low decongestant Her updated medication list for this problem includes:    Lisinopril 10 Mg Tabs (Lisinopril) .Marland Kitchen... Tomar 1 tableta cada manana (take one tablet every morning)  Complete Medication List: 1)  Lisinopril 10 Mg Tabs (Lisinopril) .... Tomar 1 tableta cada manana (take one tablet every morning) 2)  Zoloft 50 Mg Tabs (Sertraline hcl) .... Tomar 1 tableta antes de acostarse (take one tablet at bedtime) 3)  Pravastatin Sodium 20 Mg Tabs (Pravastatin sodium) .... Take 1 tablet by mouth once a day for cholesterol 4)  Fish Oil 1000 Mg Caps (Omega-3 fatty acids) .... Take 2 capsules by mouth once a day 5)  Flonase 50 Mcg/act Susp (Fluticasone propionate) .... 2 sprays each nostril once daily 6)  Meclizine Hcl 25 Mg Tabs (Meclizine hcl) .... 1/2 tab by mouth every 12 hours as needed for dizziness 7)  Tylenol Extra Strength 500 Mg Tabs (Acetaminophen) .... One tablet by mouth two  times a day as needed 8)  Diclofenac Sodium 75 Mg Tbec (Diclofenac sodium) .... One tablet by mouth by mouth two times a day for 1 week then as needed 9)  Cosamin Ds 500-400 Mg Tabs (Glucosamine-chondroitin) .... One tablet by mouth three times a day 10)  Promethazine-dm 6.25-15 Mg/43ml Syrp (Promethazine-dm) .... One teaspoon every 12 hours as needed for cough  Hypertension Assessment/Plan:      The patient's hypertensive risk group is category B: At least one risk factor (excluding diabetes) with no target organ damage.  Her calculated 10 year risk of coronary heart disease is 13 %.  Today's blood pressure is 140/70.  Her blood pressure goal is < 140/90.  Lipid  Assessment/Plan:      Based on NCEP/ATP III, the patient's risk factor category is "2 or more risk factors and a calculated 10 year CAD risk of < 20%".  The patient's lipid goals are as follows: Total cholesterol goal is 200; LDL cholesterol goal is 130; HDL cholesterol goal is 40; Triglyceride goal is 150.    Patient Instructions: 1)  You need to drink plenty of water or warm tea so that you can cough up the sputum. 2)  Get the cough medication from the pharmacy - this will help some with the cough and congestion.  Will need to make sure you can monitor blood pressure. 3)  Complete physical exam - May 4)  Since you want to continue with PAP smear will do at that time.  Mammogram will be yearly Prescriptions: PROMETHAZINE-DM 6.25-15 MG/5ML SYRP (PROMETHAZINE-DM) One teaspoon every 12 hours as needed for cough  #142ml x 0   Entered and Authorized by:   Lehman Prom FNP   Signed by:   Lehman Prom FNP on 02/04/2011   Method used:   Print then Give to Patient   RxID:   [id number] PROMETHAZINE-DM 6.25-15 MG/5ML SYRP (PROMETHAZINE-DM) One teaspoon every 6 hours as needed for cough/congestion  #156ml x 0   Entered and Authorized by:   Lehman Prom FNP   Signed by:   Lehman Prom FNP on 02/04/2011   Method used:   Print  then Give to Patient   RxID:   [id number]    Orders Added: 1)  Est. Patient Nurse visit [09003]

## 2011-06-09 ENCOUNTER — Other Ambulatory Visit (HOSPITAL_COMMUNITY): Payer: Self-pay | Admitting: Family Medicine

## 2011-06-09 DIAGNOSIS — Z1231 Encounter for screening mammogram for malignant neoplasm of breast: Secondary | ICD-10-CM

## 2011-06-21 ENCOUNTER — Ambulatory Visit (HOSPITAL_COMMUNITY)
Admission: RE | Admit: 2011-06-21 | Discharge: 2011-06-21 | Disposition: A | Discharge status: Home / Self Care | Payer: Self-pay | Source: Ambulatory Visit | Attending: Family Medicine | Admitting: Family Medicine

## 2011-06-21 DIAGNOSIS — Z1231 Encounter for screening mammogram for malignant neoplasm of breast: Secondary | ICD-10-CM | POA: Insufficient documentation

## 2015-10-13 ENCOUNTER — Encounter: Payer: Self-pay | Admitting: Gastroenterology

## 2016-07-18 NOTE — Progress Notes (Signed)
This encounter was created in error - please disregard.

## 2024-01-05 ENCOUNTER — Other Ambulatory Visit: Payer: Self-pay
